# Patient Record
Sex: Female | Born: 1985 | Race: White | Hispanic: No | Marital: Married | Smoking: Never smoker
Health system: Southern US, Community
[De-identification: ages and names within clinical notes are randomized; demographics above are authoritative.]

## PROBLEM LIST (undated history)

## (undated) DIAGNOSIS — Z789 Other specified health status: Secondary | ICD-10-CM

## (undated) DIAGNOSIS — D649 Anemia, unspecified: Secondary | ICD-10-CM

## (undated) HISTORY — PX: NO PAST SURGERIES: SHX2092

---

## 2015-11-15 DIAGNOSIS — N912 Amenorrhea, unspecified: Secondary | ICD-10-CM | POA: Insufficient documentation

## 2016-06-04 DIAGNOSIS — F411 Generalized anxiety disorder: Secondary | ICD-10-CM | POA: Insufficient documentation

## 2016-06-04 DIAGNOSIS — F321 Major depressive disorder, single episode, moderate: Secondary | ICD-10-CM | POA: Insufficient documentation

## 2016-06-04 DIAGNOSIS — G44229 Chronic tension-type headache, not intractable: Secondary | ICD-10-CM | POA: Insufficient documentation

## 2016-11-05 DIAGNOSIS — N926 Irregular menstruation, unspecified: Secondary | ICD-10-CM | POA: Insufficient documentation

## 2017-01-01 ENCOUNTER — Encounter: Payer: Self-pay | Admitting: *Deleted

## 2017-01-18 ENCOUNTER — Encounter: Payer: Self-pay | Admitting: Obstetrics and Gynecology

## 2017-01-29 NOTE — L&D Delivery Note (Signed)
Delivery Note Pt progressed quickly to complete at 0139, and at 1:42 AM a viable female was delivered via Vaginal, Spontaneous (Presentation: ROA/compound hand ).  APGAR: 9,9 ; weight: pending.  Infant lifted to pt's abd and dried; cord clamped and cut by FOB after >731min had passed. Hospital cord blood sample collected. Placenta status: spont ,intact .  Cord: 3 vessel  Anesthesia:  None Episiotomy: None Lacerations: None Est. Blood Loss (mL): 300  Mom to postpartum.  Baby to Couplet care / Skin to Skin.  Cam HaiSHAW, Leelyn Jasinski CNM 07/18/2017, 1:58 AM  Please schedule this patient for Postpartum visit in: 4 weeks with the following provider: Any provider For C/S patients schedule nurse incision check in weeks 2 weeks: no Low risk pregnancy complicated by: Rh neg, IVF preg Delivery mode:  SVD Anticipated Birth Control:  Other/unsure; NFP PP Procedures needed: none  Schedule Integrated BH visit: no

## 2017-02-14 ENCOUNTER — Encounter: Payer: Self-pay | Admitting: Student

## 2017-02-14 ENCOUNTER — Ambulatory Visit (INDEPENDENT_AMBULATORY_CARE_PROVIDER_SITE_OTHER): Payer: Managed Care, Other (non HMO) | Admitting: Student

## 2017-02-14 VITALS — BP 113/62 | HR 93 | Ht 64.0 in | Wt 127.3 lb

## 2017-02-14 DIAGNOSIS — Z3482 Encounter for supervision of other normal pregnancy, second trimester: Secondary | ICD-10-CM

## 2017-02-14 DIAGNOSIS — Z23 Encounter for immunization: Secondary | ICD-10-CM | POA: Diagnosis not present

## 2017-02-14 DIAGNOSIS — E282 Polycystic ovarian syndrome: Secondary | ICD-10-CM

## 2017-02-14 DIAGNOSIS — Z3480 Encounter for supervision of other normal pregnancy, unspecified trimester: Secondary | ICD-10-CM

## 2017-02-14 DIAGNOSIS — N970 Female infertility associated with anovulation: Secondary | ICD-10-CM

## 2017-02-14 DIAGNOSIS — Z113 Encounter for screening for infections with a predominantly sexual mode of transmission: Secondary | ICD-10-CM | POA: Diagnosis not present

## 2017-02-14 NOTE — Patient Instructions (Signed)
Considering Waterbirth? Guide for patients at [facility] [employer] [facility]  Why consider waterbirth?  . Gentle birth for babies . Less pain medicine used in labor . May allow for passive descent/less pushing . May reduce perineal tears  . More mobility and instinctive maternal position changes . Increased maternal relaxation . Reduced blood pressure in labor  Is waterbirth safe? What are the risks of infection, drowning or other complications?  . Infection: o Very low risk (3.7 % for tub vs 4.8% for bed) o 7 in 8000 waterbirths with documented infection o Poorly cleaned equipment most common cause o Slightly lower group B strep transmission rate  . Drowning o Maternal:  - Very low risk   - Related to seizures or fainting o Newborn:  - Very low risk. No evidence of increased risk of respiratory problems in multiple large studies - Physiological protection from breathing under water - Avoid underwater birth if there are any fetal complications - Once baby's head is out of the water, keep it out.  . Birth complication o Some reports of cord trauma, but risk decreased by bringing baby to surface gradually o No evidence of increased risk of shoulder dystocia. Mothers can usually change positions faster in water than in a bed, possibly aiding the maneuvers to free the shoulder.   You must attend a Doren Custard class at [facility]  3rd Wednesday of every month from 7-9pm  [employer] by calling [phone] or online at [email or web address]  Bring [place] the certificate from the class to your prenatal appointment  Meet with a midwife at 36 weeks to see if you can still plan a waterbirth and to sign the consent.   Purchase or rent the following supplies:   Water Birth Pool (Birth Pool in a Box or [place] for instance)  (Tubs start ~$125)  Single-use disposable tub liner designed for your brand of tub  New garden hose labeled "lead-free", "suitable for drinking  water",  Electric drain pump to remove water (We recommend 792 gallon per hour or greater pump.)   Separate garden hose to remove the dirty water  Fish net  Bathing suit top (optional)  Long-handled mirror (optional)  Places to purchase or rent supplies  GotWeb[employer].is for tub purchases and supplies  Waterbirthsolutions.com for tub purchases and supplies  [employer] Ladies (www.thelaborladies.com) $275 for tub rental/set-up & take down/kit   [employer] (http://www.fleming.com/.htm) Information regarding doulas (labor support) who provide pool rentals  Our practice has a Birth Pool in a Box tub at the hospital that you may borrow on a first-come-first-served basis. It is your responsibility to to set up, clean and break down the tub. We cannot guarantee the availability of this tub in advance. You are responsible for bringing all accessories listed above. If you do not have all necessary supplies you cannot have a waterbirth.    Things that would prevent you from having a waterbirth:  Premature, <37wks  Previous cesarean birth  Presence of thick meconium-stained fluid  Multiple gestation (Twins, triplets, etc.)  Uncontrolled diabetes or gestational diabetes requiring medication  Hypertension requiring medication or diagnosis of pre-eclampsia  Heavy vaginal bleeding  Non-reassuring fetal heart rate  Active infection (MRSA, etc.). Group B Strep is NOT a contraindication for  waterbirth.  If your labor has to be induced and induction method requires continuous  monitoring of the baby's heart rate  Other risks/issues identified by your obstetrical provider  Please remember that birth is unpredictable. Under certain unforeseeable  circumstances your provider may advise against giving birth in the tub. These decisions will be made on a case-by-case basis and with the safety of you and your baby as our highest priority.

## 2017-02-14 NOTE — Progress Notes (Signed)
  Subjective:    Amber BridgeChristy Garrison is being seen today for her first obstetrical visit.  This is a planned pregnancy. She is at 1066w3d gestation. Her obstetrical history is significant for IVF pregnancy. . Relationship with FOB: spouse, living together. Patient does intend to breast feed. Pregnancy history fully reviewed.  Patient reports no complaints; occasional vomiting (every other day). Feels good overall.   Review of Systems:   Review of Systems  Constitutional: Negative.   HENT: Negative.   Respiratory: Negative.   Cardiovascular: Negative.   Gastrointestinal: Negative.   Genitourinary: Negative.   Neurological: Negative.   Hematological: Negative.     Objective:     BP 113/62   Pulse 93   Ht 5\' 4"  (1.626 m)   Wt 127 lb 4.8 oz (57.7 kg)   LMP 10/15/2016 (Exact Date)   BMI 21.85 kg/m  Physical Exam  Constitutional: She is oriented to person, place, and time. She appears well-developed.  HENT:  Head: Normocephalic.  Eyes: Pupils are equal, round, and reactive to light.  Neck: Normal range of motion.  Cardiovascular: Normal rate.  Respiratory: Effort normal.  GI: Soft.  Musculoskeletal: Normal range of motion.  Neurological: She is alert and oriented to person, place, and time.  Skin: Skin is warm and dry.    Exam    Assessment:    Pregnancy: [id number] Patient Active Problem List   Diagnosis Date Noted  . Supervision of other normal pregnancy, antepartum 02/14/2017  . Infertility associated with anovulation 02/14/2017       Plan:     Initial labs drawn. Prenatal vitamins. Problem list reviewed and updated. AFP3 discussed: declined. Role of ultrasound in pregnancy discussed; fetal survey: ordered. Amniocentesis discussed: not indicated. Follow up in 4 weeks. 50% of 30  min visit spent on counseling and coordination of care.  -[facility] and explained role of residents, etc.  -Information given on waterbirth and on Vitamin b6 and  unisom.   Amber GaribaldiKathryn Lorraine [facility] 02/14/2017

## 2017-02-15 LAB — OBSTETRIC PANEL, INCLUDING HIV
Antibody Screen: NEGATIVE
BASOS ABS: 0 10*3/uL (ref 0.0–0.2)
Basos: 0 %
EOS (ABSOLUTE): 0.1 10*3/uL (ref 0.0–0.4)
Eos: 1 %
HIV SCREEN 4TH GENERATION: NONREACTIVE
Hematocrit: 39.1 % (ref 34.0–46.6)
Hemoglobin: 13.2 g/dL (ref 11.1–15.9)
Hepatitis B Surface Ag: NEGATIVE
IMMATURE GRANULOCYTES: 0 %
Immature Grans (Abs): 0 10*3/uL (ref 0.0–0.1)
LYMPHS ABS: 1.1 10*3/uL (ref 0.7–3.1)
Lymphs: 14 %
MCH: 30.9 pg (ref 26.6–33.0)
MCHC: 33.8 g/dL (ref 31.5–35.7)
MCV: 92 fL (ref 79–97)
MONOCYTES: 6 %
Monocytes Absolute: 0.4 10*3/uL (ref 0.1–0.9)
NEUTROS ABS: 5.7 10*3/uL (ref 1.4–7.0)
NEUTROS PCT: 79 %
PLATELETS: 182 10*3/uL (ref 150–379)
RBC: 4.27 x10E6/uL (ref 3.77–5.28)
RDW: 14.2 % (ref 12.3–15.4)
RPR Ser Ql: NONREACTIVE
Rh Factor: NEGATIVE
Rubella Antibodies, IGG: 3.1 index (ref >0.99)
WBC: 7.3 10*3/uL (ref 3.4–10.8)

## 2017-02-15 LAB — GC/CHLAMYDIA PROBE AMP (~~LOC~~) NOT AT ARMC
CHLAMYDIA, DNA PROBE: NEGATIVE
NEISSERIA GONORRHEA: NEGATIVE

## 2017-02-16 ENCOUNTER — Encounter: Payer: Self-pay | Admitting: Student

## 2017-02-16 DIAGNOSIS — O26899 Other specified pregnancy related conditions, unspecified trimester: Secondary | ICD-10-CM

## 2017-02-16 DIAGNOSIS — Z6791 Unspecified blood type, Rh negative: Secondary | ICD-10-CM | POA: Insufficient documentation

## 2017-02-16 LAB — CULTURE, OB URINE

## 2017-02-16 LAB — URINE CULTURE, OB REFLEX: Organism ID, Bacteria: NO GROWTH

## 2017-02-21 ENCOUNTER — Encounter (HOSPITAL_COMMUNITY): Payer: Self-pay | Admitting: Student

## 2017-02-21 LAB — SMN1 COPY NUMBER ANALYSIS (SMA CARRIER SCREENING)

## 2017-02-28 ENCOUNTER — Ambulatory Visit (HOSPITAL_COMMUNITY)
Admission: RE | Admit: 2017-02-28 | Discharge: 2017-02-28 | Disposition: A | Discharge status: Home / Self Care | Payer: Managed Care, Other (non HMO) | Source: Ambulatory Visit | Attending: Student | Admitting: Student

## 2017-02-28 ENCOUNTER — Encounter: Payer: Self-pay | Admitting: Student

## 2017-02-28 ENCOUNTER — Other Ambulatory Visit: Payer: Self-pay | Admitting: Student

## 2017-02-28 ENCOUNTER — Other Ambulatory Visit (HOSPITAL_COMMUNITY): Payer: Self-pay | Admitting: *Deleted

## 2017-02-28 ENCOUNTER — Encounter (HOSPITAL_COMMUNITY): Payer: Self-pay

## 2017-02-28 DIAGNOSIS — N970 Female infertility associated with anovulation: Secondary | ICD-10-CM

## 2017-02-28 DIAGNOSIS — Z3A19 19 weeks gestation of pregnancy: Secondary | ICD-10-CM | POA: Diagnosis not present

## 2017-02-28 DIAGNOSIS — Z363 Encounter for antenatal screening for malformations: Secondary | ICD-10-CM | POA: Insufficient documentation

## 2017-02-28 DIAGNOSIS — O4440 Low lying placenta NOS or without hemorrhage, unspecified trimester: Secondary | ICD-10-CM

## 2017-02-28 HISTORY — DX: Other specified health status: Z78.9

## 2017-03-15 ENCOUNTER — Encounter: Payer: Managed Care, Other (non HMO) | Admitting: Student

## 2017-03-26 ENCOUNTER — Ambulatory Visit (INDEPENDENT_AMBULATORY_CARE_PROVIDER_SITE_OTHER): Payer: Managed Care, Other (non HMO) | Admitting: Advanced Practice Midwife

## 2017-03-26 ENCOUNTER — Encounter: Payer: Self-pay | Admitting: Advanced Practice Midwife

## 2017-03-26 VITALS — BP 103/58 | HR 79 | Wt 135.0 lb

## 2017-03-26 DIAGNOSIS — Z3480 Encounter for supervision of other normal pregnancy, unspecified trimester: Secondary | ICD-10-CM

## 2017-03-26 DIAGNOSIS — O09899 Supervision of other high risk pregnancies, unspecified trimester: Secondary | ICD-10-CM

## 2017-03-26 DIAGNOSIS — O26899 Other specified pregnancy related conditions, unspecified trimester: Principal | ICD-10-CM

## 2017-03-26 DIAGNOSIS — Z6791 Unspecified blood type, Rh negative: Secondary | ICD-10-CM

## 2017-03-26 NOTE — Patient Instructions (Signed)
Second Trimester of Pregnancy The second trimester is from week 13 through week 28, month 4 through 6. This is often the time in pregnancy that you feel your best. Often times, morning sickness has lessened or quit. You may have more energy, and you may get hungry more often. Your unborn baby (fetus) is growing rapidly. At the end of the sixth month, he or she is about 9 inches long and weighs about 1 pounds. You will likely feel the baby move (quickening) between 18 and 20 weeks of pregnancy. Follow these instructions at home:  Avoid all smoking, herbs, and alcohol. Avoid drugs not approved by your doctor.  Do not use any tobacco products, including cigarettes, chewing tobacco, and electronic cigarettes. If you need help quitting, ask your doctor. You may get counseling or other support to help you quit.  Only take medicine as told by your doctor. Some medicines are safe and some are not during pregnancy.  Exercise only as told by your doctor. Stop exercising if you start having cramps.  Eat regular, healthy meals.  Wear a good support bra if your breasts are tender.  Do not use hot tubs, steam rooms, or saunas.  Wear your seat belt when driving.  Avoid raw meat, uncooked cheese, and liter boxes and soil used by cats.  Take your prenatal vitamins.  Take 1500-2000 milligrams of calcium daily starting at the 20th week of pregnancy until you deliver your baby.  Try taking medicine that helps you poop (stool softener) as needed, and if your doctor approves. Eat more fiber by eating fresh fruit, vegetables, and whole grains. Drink enough fluids to keep your pee (urine) clear or pale yellow.  Take warm water baths (sitz baths) to soothe pain or discomfort caused by hemorrhoids. Use hemorrhoid cream if your doctor approves.  If you have puffy, bulging veins (varicose veins), wear support hose. Raise (elevate) your feet for 15 minutes, 3-4 times a day. Limit salt in your diet.  Avoid heavy  lifting, wear low heals, and sit up straight.  Rest with your legs raised if you have leg cramps or low back pain.  Visit your dentist if you have not gone during your pregnancy. Use a soft toothbrush to brush your teeth. Be gentle when you floss.  You can have sex (intercourse) unless your doctor tells you not to.  Go to your doctor visits. Get help if:  You feel dizzy.  You have mild cramps or pressure in your lower belly (abdomen).  You have a nagging pain in your belly area.  You continue to feel sick to your stomach (nauseous), throw up (vomit), or have watery poop (diarrhea).  You have bad smelling fluid coming from your vagina.  You have pain with peeing (urination). Get help right away if:  You have a fever.  You are leaking fluid from your vagina.  You have spotting or bleeding from your vagina.  You have severe belly cramping or pain.  You lose or gain weight rapidly.  You have trouble catching your breath and have chest pain.  You notice sudden or extreme puffiness (swelling) of your face, hands, ankles, feet, or legs.  You have not felt the baby move in over an hour.  You have severe headaches that do not go away with medicine.  You have vision changes. This information is not intended to replace advice given to you by your health care provider. Make sure you discuss any questions you have with your health care   provider. Document Released: 04/11/2009 Document Revised: 06/23/2015 Document Reviewed: 03/18/2012 Elsevier Interactive Patient Education  2017 Elsevier Inc.  

## 2017-03-26 NOTE — Progress Notes (Signed)
   PRENATAL VISIT NOTE  Subjective:  Amber Garrison is a 32 y.o. G3P2002 at 6230w1d being seen today for ongoing prenatal care.  She is currently monitored for the following issues for this low-risk pregnancy and has Supervision of other normal pregnancy, antepartum; Infertility associated with anovulation; PCOS (polycystic ovarian syndrome); Rh negative state in antepartum period; Low-lying placenta; Amenorrhea; Chronic tension-type headache, not intractable; Current moderate episode of major depressive disorder without prior episode (HCC); GAD (generalized anxiety disorder); and Irregular periods/menstrual cycles on their problem list.  Patient reports intermittent groin pain when sitting.  Contractions: Not present. Vag. Bleeding: None.  Movement: Present. Denies leaking of fluid.   The following portions of the patient's history were reviewed and updated as appropriate: allergies, current medications, past family history, past medical history, past social history, past surgical history and problem list. Problem list updated.  Objective:   Vitals:   03/26/17 0838  BP: (!) 103/58  Pulse: 79  Weight: 135 lb (61.2 kg)    Fetal Status: Fetal Heart Rate (bpm): 155   Movement: Present     General:  Alert, oriented and cooperative. Patient is in no acute distress.  Skin: Skin is warm and dry. No rash noted.   Cardiovascular: Normal heart rate noted  Respiratory: Normal respiratory effort, no problems with respiration noted  Abdomen: Soft, gravid, appropriate for gestational age.  Pain/Pressure: Present     Pelvic: Cervical exam deferred        Extremities: Normal range of motion.  Edema: None  Mental Status:  Normal mood and affect. Normal behavior. Normal judgment and thought content.   Assessment and Plan:  Pregnancy: G3P2002 at 2930w1d  1. Supervision of other normal pregnancy, antepartum     Discussed groin pain is likely due to ligament softening.  Pillows, position changes discussed   Discussed waterbirth in detail.  Packet given.  May want to use our tub, reviewed responsibilities. Last labor was fast, discussed time management if doing waterbirth. Wants photography, discussed none of birth but before and after are OK.       Reviewed hospital staffing with fellows and midwives.  Reviewed MAU, how and when to use  2.    Rh Neg, Rhig next visit  Preterm labor symptoms and general obstetric precautions including but not limited to vaginal bleeding, contractions, leaking of fluid and fetal movement were reviewed in detail with the patient. Please refer to After Visit Summary for other counseling recommendations.   RTO 4 wks for glucola and Rhophylac   Wynelle BourgeoisMarie Jil Penland, CNM

## 2017-04-11 ENCOUNTER — Encounter (HOSPITAL_COMMUNITY): Payer: Self-pay

## 2017-04-11 ENCOUNTER — Ambulatory Visit (HOSPITAL_COMMUNITY)
Admission: RE | Admit: 2017-04-11 | Discharge: 2017-04-11 | Disposition: A | Discharge status: Home / Self Care | Payer: Managed Care, Other (non HMO) | Source: Ambulatory Visit | Attending: Student | Admitting: Student

## 2017-04-11 ENCOUNTER — Encounter: Payer: Managed Care, Other (non HMO) | Admitting: Student

## 2017-04-11 ENCOUNTER — Other Ambulatory Visit (HOSPITAL_COMMUNITY): Payer: Self-pay | Admitting: *Deleted

## 2017-04-11 ENCOUNTER — Other Ambulatory Visit (HOSPITAL_COMMUNITY): Payer: Self-pay | Admitting: Maternal and Fetal Medicine

## 2017-04-11 DIAGNOSIS — Z362 Encounter for other antenatal screening follow-up: Secondary | ICD-10-CM

## 2017-04-11 DIAGNOSIS — O09812 Supervision of pregnancy resulting from assisted reproductive technology, second trimester: Secondary | ICD-10-CM

## 2017-04-11 DIAGNOSIS — Z3A25 25 weeks gestation of pregnancy: Secondary | ICD-10-CM | POA: Diagnosis not present

## 2017-04-11 DIAGNOSIS — O4440 Low lying placenta NOS or without hemorrhage, unspecified trimester: Secondary | ICD-10-CM

## 2017-04-23 ENCOUNTER — Ambulatory Visit (INDEPENDENT_AMBULATORY_CARE_PROVIDER_SITE_OTHER): Payer: Managed Care, Other (non HMO) | Admitting: Advanced Practice Midwife

## 2017-04-23 ENCOUNTER — Encounter: Payer: Self-pay | Admitting: Advanced Practice Midwife

## 2017-04-23 VITALS — BP 104/61 | HR 79 | Wt 136.0 lb

## 2017-04-23 DIAGNOSIS — Z23 Encounter for immunization: Secondary | ICD-10-CM | POA: Diagnosis not present

## 2017-04-23 DIAGNOSIS — Z3480 Encounter for supervision of other normal pregnancy, unspecified trimester: Secondary | ICD-10-CM

## 2017-04-23 DIAGNOSIS — Z3482 Encounter for supervision of other normal pregnancy, second trimester: Secondary | ICD-10-CM | POA: Diagnosis not present

## 2017-04-23 DIAGNOSIS — O360920 Maternal care for other rhesus isoimmunization, second trimester, not applicable or unspecified: Secondary | ICD-10-CM

## 2017-04-23 DIAGNOSIS — Z3490 Encounter for supervision of normal pregnancy, unspecified, unspecified trimester: Secondary | ICD-10-CM

## 2017-04-23 DIAGNOSIS — K219 Gastro-esophageal reflux disease without esophagitis: Secondary | ICD-10-CM

## 2017-04-23 MED ORDER — RHO D IMMUNE GLOBULIN 1500 UNIT/2ML IJ SOSY
300.0000 ug | PREFILLED_SYRINGE | Freq: Once | INTRAMUSCULAR | Status: AC
  Administered 2017-04-23: 300 ug via INTRAMUSCULAR

## 2017-04-23 NOTE — Progress Notes (Signed)
   PRENATAL VISIT NOTE  Subjective:  Amber Garrison is a 32 y.o. G3P2002 at 2221w1d being seen today for ongoing prenatal care.  She is currently monitored for the following issues for this high-risk pregnancy and has Supervision of other normal pregnancy, antepartum; Infertility associated with anovulation; PCOS (polycystic ovarian syndrome); Rh negative state in antepartum period; Low-lying placenta; Amenorrhea; Chronic tension-type headache, not intractable; Current moderate episode of major depressive disorder without prior episode (HCC); GAD (generalized anxiety disorder); and Irregular periods/menstrual cycles on their problem list.  Patient reports no complaints.  Contractions: Not present. Vag. Bleeding: None.  Movement: Present. Denies leaking of fluid.   The following portions of the patient's history were reviewed and updated as appropriate: allergies, current medications, past family history, past medical history, past social history, past surgical history and problem list. Problem list updated.  Objective:   Vitals:   04/23/17 0815  BP: 104/61  Pulse: 79  Weight: 136 lb (61.7 kg)    Fetal Status:   Fundal Height: 28 cm Movement: Present     General:  Alert, oriented and cooperative. Patient is in no acute distress.  Skin: Skin is warm and dry. No rash noted.   Cardiovascular: Normal heart rate noted  Respiratory: Normal respiratory effort, no problems with respiration noted  Abdomen: Soft, gravid, appropriate for gestational age.  Pain/Pressure: Present     Pelvic: Cervical exam deferred        Extremities: Normal range of motion.  Edema: None  Mental Status:  Normal mood and affect. Normal behavior. Normal judgment and thought content.  US findings: SIUP at 25+3 weeks  Normal interval anatomy; anatomic survey complete  Normal amniotic fluid volume  Appropriate interval growth with EFW at the 57th %tile  EV views of cervix and placenta: normal length without  funneling;  inferior edge of posterior placenta ends 2.1 cms  from internal os  Posterior placenta; no previa ---------------------------------------------------------------------- Recommendations  Follow-up ultrasound in 6 weeks to reassess growth and  placenta location  Assessment and Plan:  Pregnancy: G3P2002 at 4121w1d  1. Prenatal care, antepartum      Has class for waterbirth coming up       Is scheduled for followup US for growth and placental location (now away from os)  - Glucose Tolerance, 2 Hours w/1 Hour - RPR - HIV antibody (with reflex) - CBC - Rh Type  2.    Acid reflux      Reviewed options of conservative management (elevated HOB, water after dinner), acid reducers and Reglan to help with gastric emptying.  Prefers not to use medication.  3.    RH Negative      Rhophylac today  Preterm labor symptoms and general obstetric precautions including but not limited to vaginal bleeding, contractions, leaking of fluid and fetal movement were reviewed in detail with the patient. Please refer to After Visit Summary for other counseling recommendations.   RTO 2 weeks   Wynelle BourgeoisMarie Gibril Mastro, CNM

## 2017-04-23 NOTE — Patient Instructions (Signed)

## 2017-04-23 NOTE — Progress Notes (Incomplete)
   PRENATAL VISIT NOTE  Subjective:  Amber Garrison is a 32 y.o. G3P2002 at [redacted]w[redacted]d being seen today for ongoing prenatal care.  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  Patient reports {sx:14538}.  Contractions: Not present. Vag. Bleeding: None.  Movement: Present. Denies leaking of fluid.   The following portions of the patient's history were reviewed and updated as appropriate: allergies, current medications, past family history, past medical history, past social history, past surgical history and problem list. Problem list updated.  Objective:   Vitals:   04/23/17 0815  BP: 104/61  Pulse: 79  Weight: 136 lb (61.7 kg)    Fetal Status:   Fundal Height: 28 cm Movement: Present     General:  Alert, oriented and cooperative. Patient is in no acute distress.  Skin: Skin is warm and dry. No rash noted.   Cardiovascular: Normal heart rate noted  Respiratory: Normal respiratory effort, no problems with respiration noted  Abdomen: Soft, gravid, appropriate for gestational age.  Pain/Pressure: Present     Pelvic: {Blank single:19197::"Cervical exam performed","Cervical exam deferred"}        Extremities: Normal range of motion.  Edema: None  Mental Status:  Normal mood and affect. Normal behavior. Normal judgment and thought content.   Assessment and Plan:  Pregnancy: G3P2002 at [redacted]w[redacted]d  1. Prenatal care, antepartum *** - Glucose Tolerance, 2 Hours w/1 Hour - RPR - HIV antibody (with  reflex) - CBC - Rh Type  {Blank single:19197::"Term","Preterm"} labor symptoms and general obstetric precautions including but not limited to vaginal bleeding, contractions, leaking of fluid and fetal movement were reviewed in detail with the patient. Please refer to After Visit Summary for other counseling recommendations.  No follow-ups on file.   Nasir Bright, CNM 

## 2017-04-23 NOTE — Progress Notes (Deleted)
   PRENATAL VISIT NOTE  Subjective:  Amber Garrison is a 31 y.o. G3P2002 at [redacted]w[redacted]d being seen today for ongoing prenatal care.  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  Patient reports {sx:14538}.  Contractions: Not present. Vag. Bleeding: None.  Movement: Present. Denies leaking of fluid.   The following portions of the patient's history were reviewed and updated as appropriate: allergies, current medications, past family history, past medical history, past social history, past surgical history and problem list. Problem list updated.  Objective:   Vitals:   04/23/17 0815  BP: 104/61  Pulse: 79  Weight: 136 lb (61.7 kg)    Fetal Status:   Fundal Height: 28 cm Movement: Present     General:  Alert, oriented and cooperative. Patient is in no acute distress.  Skin: Skin is warm and dry. No rash noted.   Cardiovascular: Normal heart rate noted  Respiratory: Normal respiratory effort, no problems with respiration noted  Abdomen: Soft, gravid, appropriate for gestational age.  Pain/Pressure: Present     Pelvic: {Blank single:19197::"Cervical exam performed","Cervical exam deferred"}        Extremities: Normal range of motion.  Edema: None  Mental Status:  Normal mood and affect. Normal behavior. Normal judgment and thought content.   Assessment and Plan:  Pregnancy: G3P2002 at [redacted]w[redacted]d  1. Prenatal care, antepartum *** - Glucose Tolerance, 2 Hours w/1 Hour - RPR - HIV antibody (with  reflex) - CBC - Rh Type  {Blank single:19197::"Term","Preterm"} labor symptoms and general obstetric precautions including but not limited to vaginal bleeding, contractions, leaking of fluid and fetal movement were reviewed in detail with the patient. Please refer to After Visit Summary for other counseling recommendations.  No follow-ups on file.   Ayaana Biondo, CNM 

## 2017-04-23 NOTE — Progress Notes (Incomplete)
   PRENATAL VISIT NOTE  Subjective:  Amber Garrison is a 32 y.o. G3P2002 at 7052w1d being seen today for ongoing prenatal care.  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  Patient reports {sx:14538}.  Contractions: Not present. Vag. Bleeding: None.  Movement: Present. Denies leaking of fluid.   The following portions of the patient's history were reviewed and updated as appropriate: allergies, current medications, past family history, past medical history, past social history, past surgical history and problem list. Problem list updated.  Objective:   Vitals:   04/23/17 0815  BP: 104/61  Pulse: 79  Weight: 136 lb (61.7 kg)    Fetal Status:   Fundal Height: 28 cm Movement: Present     General:  Alert, oriented and cooperative. Patient is in no acute distress.  Skin: Skin is warm and dry. No rash noted.   Cardiovascular: Normal heart rate noted  Respiratory: Normal respiratory effort, no problems with respiration noted  Abdomen: Soft, gravid, appropriate for gestational age.  Pain/Pressure: Present     Pelvic: {Blank single:19197::"Cervical exam performed","Cervical exam deferred"}        Extremities: Normal range of motion.  Edema: None  Mental Status:  Normal mood and affect. Normal behavior. Normal judgment and thought content.   Assessment and Plan:  Pregnancy: G3P2002 at 6652w1d  1. Prenatal care, antepartum *** - Glucose Tolerance, 2 Hours w/1 Hour - RPR - HIV antibody (with  reflex) - CBC - Rh Type  {Blank single:19197::"Term","Preterm"} labor symptoms and general obstetric precautions including but not limited to vaginal bleeding, contractions, leaking of fluid and fetal movement were reviewed in detail with the patient. Please refer to After Visit Summary for other counseling recommendations.  No follow-ups on file.   Wynelle BourgeoisMarie Donterius Filley, CNM

## 2017-04-24 LAB — RPR: RPR: NONREACTIVE

## 2017-04-24 LAB — CBC
Hematocrit: 33.7 % — ABNORMAL LOW (ref 34.0–46.6)
Hemoglobin: 11.2 g/dL (ref 11.1–15.9)
MCH: 29.6 pg (ref 26.6–33.0)
MCHC: 33.2 g/dL (ref 31.5–35.7)
MCV: 89 fL (ref 79–97)
Platelets: 169 10*3/uL (ref 150–379)
RBC: 3.79 x10E6/uL (ref 3.77–5.28)
RDW: 14.1 % (ref 12.3–15.4)
WBC: 8.6 10*3/uL (ref 3.4–10.8)

## 2017-04-24 LAB — GLUCOSE TOLERANCE, 2 HOURS W/ 1HR
GLUCOSE, 1 HOUR: 119 mg/dL (ref 65–179)
GLUCOSE, 2 HOUR: 116 mg/dL (ref 65–152)
GLUCOSE, FASTING: 76 mg/dL (ref 65–91)

## 2017-04-24 LAB — HIV ANTIBODY (ROUTINE TESTING W REFLEX): HIV SCREEN 4TH GENERATION: NONREACTIVE

## 2017-04-24 LAB — RH TYPE: RH TYPE: NEGATIVE

## 2017-05-14 ENCOUNTER — Ambulatory Visit (INDEPENDENT_AMBULATORY_CARE_PROVIDER_SITE_OTHER): Payer: Managed Care, Other (non HMO) | Admitting: Nurse Practitioner

## 2017-05-14 VITALS — BP 107/67 | HR 89 | Wt 136.0 lb

## 2017-05-14 DIAGNOSIS — Z3483 Encounter for supervision of other normal pregnancy, third trimester: Secondary | ICD-10-CM

## 2017-05-14 DIAGNOSIS — Z3480 Encounter for supervision of other normal pregnancy, unspecified trimester: Secondary | ICD-10-CM

## 2017-05-14 NOTE — Progress Notes (Signed)
Pt c/o feeling dizzy and out of breath.

## 2017-05-14 NOTE — Patient Instructions (Signed)
Eat protein with every meal.

## 2017-05-14 NOTE — Progress Notes (Signed)
    Subjective:  Amber BridgeChristy Garrison is a 32 y.o. G3P2002 at 2742w1d being seen today for ongoing prenatal care.  She is currently monitored for the following issues for this low-risk pregnancy and has Supervision of other normal pregnancy, antepartum; Infertility associated with anovulation; PCOS (polycystic ovarian syndrome); Rh negative state in antepartum period; Low-lying placenta; Amenorrhea; Chronic tension-type headache, not intractable; Current moderate episode of major depressive disorder without prior episode (HCC); GAD (generalized anxiety disorder); Irregular periods/menstrual cycles; and Gastroesophageal reflux on their problem list.  Patient reports having dizziness and feeling out of breath different from her other pregnancies but she is working now and was not working in her other pregnancies..  Contractions: Not present. Vag. Bleeding: None.  Movement: Present. Denies leaking of fluid.   The following portions of the patient's history were reviewed and updated as appropriate: allergies, current medications, past family history, past medical history, past social history, past surgical history and problem list. Problem list updated.  Objective:   Vitals:   05/14/17 0832  BP: 107/67  Pulse: 89  Weight: 136 lb (61.7 kg)    Fetal Status: Fetal Heart Rate (bpm): 141 Fundal Height: 30 cm Movement: Present     General:  Alert, oriented and cooperative. Patient is in no acute distress.  Skin: Skin is warm and dry. No rash noted.   Cardiovascular: Normal heart rate noted  Respiratory: Normal respiratory effort, no problems with respiration noted  Abdomen: Soft, gravid, appropriate for gestational age. Pain/Pressure: Absent     Pelvic:  Cervical exam deferred        Extremities: Normal range of motion.  Edema: None  Mental Status: Normal mood and affect. Normal behavior. Normal judgment and thought content.   Urinalysis:      Assessment and Plan:  Pregnancy: G3P2002 at 3442w1d  1.  Supervision of other normal pregnancy, antepartum Discussed dizziness and eating protein at every meal - today had oatmeal with no protein.  Advised that protein at every meal will stabilize blood sugar and may decrease episodes of dizziness.  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  Drink at least 8 8-oz glasses of water every day. Does not want any medication for heartburn - taking tums periodically Has US on 06-06-17 to evaluate low lying placenta again - currently no bleeding.  Preterm labor symptoms and general obstetric precautions including but not limited to vaginal bleeding, contractions, leaking of fluid and fetal movement were reviewed in detail with the patient. Please refer to After Visit Summary for other counseling recommendations.  Return in about 1 week (around 05/21/2017).  Nolene BernheimERRI Sewell Pitner, RN, MSN, NP-BC Nurse Practitioner, [facility], [facility] 05/14/2017 9:14 AM

## 2017-05-28 ENCOUNTER — Encounter: Payer: Self-pay | Admitting: Advanced Practice Midwife

## 2017-05-28 ENCOUNTER — Ambulatory Visit (INDEPENDENT_AMBULATORY_CARE_PROVIDER_SITE_OTHER): Payer: Managed Care, Other (non HMO) | Admitting: Advanced Practice Midwife

## 2017-05-28 VITALS — BP 107/60 | HR 96 | Wt 138.0 lb

## 2017-05-28 DIAGNOSIS — Z3480 Encounter for supervision of other normal pregnancy, unspecified trimester: Secondary | ICD-10-CM

## 2017-05-28 DIAGNOSIS — O4440 Low lying placenta NOS or without hemorrhage, unspecified trimester: Secondary | ICD-10-CM

## 2017-05-28 NOTE — Progress Notes (Incomplete)
   PRENATAL VISIT NOTE  Subjective:  Amber Garrison is a 31 y.o. G3P2002 at [redacted]w[redacted]d being seen today for ongoing prenatal care.  She is currently monitored for the following issues for this {Blank single:19197::"high-risk","low-risk"} pregnancy and has Supervision of other normal pregnancy, antepartum; Infertility associated with anovulation; PCOS (polycystic ovarian syndrome); Rh negative state in antepartum period; Low-lying placenta; Amenorrhea; Chronic tension-type headache, not intractable; Current moderate episode of major depressive disorder without prior episode (HCC); GAD (generalized anxiety disorder); Irregular periods/menstrual cycles; and Gastroesophageal reflux on their problem list.  Patient reports {sx:14538}.  Contractions: Not present. Vag. Bleeding: None.  Movement: Present. Denies leaking of fluid.   The following portions of the patient's history were reviewed and updated as appropriate: allergies, current medications, past family history, past medical history, past social history, past surgical history and problem list. Problem list updated.  Objective:   Vitals:   05/28/17 0834  BP: 107/60  Pulse: 96  Weight: 138 lb (62.6 kg)    Fetal Status:     Movement: Present     General:  Alert, oriented and cooperative. Patient is in no acute distress.  Skin: Skin is warm and dry. No rash noted.   Cardiovascular: Normal heart rate noted  Respiratory: Normal respiratory effort, no problems with respiration noted  Abdomen: Soft, gravid, appropriate for gestational age.  Pain/Pressure: Absent     Pelvic: {Blank single:19197::"Cervical exam performed","Cervical exam deferred"}        Extremities: Normal range of motion.  Edema: None  Mental Status: Normal mood and affect. Normal behavior. Normal judgment and thought content.   Assessment and Plan:  Pregnancy: G3P2002 at [redacted]w[redacted]d  1. Low-lying placenta ***  {Blank single:19197::"Term","Preterm"} labor symptoms and general  obstetric precautions including but not limited to vaginal bleeding, contractions, leaking of fluid and fetal movement were reviewed in detail with the patient. Please refer to After Visit Summary for other counseling recommendations.  No follow-ups on file.  Future Appointments  Date Time Provider Department Center  06/06/2017  8:15 AM [facility] US 4 [facility]  06/12/2017  4:00 PM Anyanwu, Ugonna A, MD [facility] None    Kinneth Fujiwara, CNM 

## 2017-05-28 NOTE — Progress Notes (Signed)
   PRENATAL VISIT NOTE  Subjective:  Amber Garrison is a 32 y.o. G3P2002 at [redacted]w[redacted]d being seen today for ongoing prenatal care.  She is currently monitored for the following issues for this low-risk pregnancy and has Supervision of other normal pregnancy, antepartum; Infertility associated with anovulation; PCOS (polycystic ovarian syndrome); Rh negative state in antepartum period; Low-lying placenta; Amenorrhea; Chronic tension-type headache, not intractable; Current moderate episode of major depressive disorder without prior episode (HCC); GAD (generalized anxiety disorder); Irregular periods/menstrual cycles; and Gastroesophageal reflux on their problem list.  Patient reports no complaints and Planning waterbirth.  Stopping work soon.  Contractions: Not present. Vag. Bleeding: None.  Movement: Present. Denies leaking of fluid.   The following portions of the patient's history were reviewed and updated as appropriate: allergies, current medications, past family history, past medical history, past social history, past surgical history and problem list. Problem list updated.  Objective:   Vitals:   05/28/17 0834  BP: 107/60  Pulse: 96  Weight: 138 lb (62.6 kg)    Fetal Status:     Movement: Present     General:  Alert, oriented and cooperative. Patient is in no acute distress.  Skin: Skin is warm and dry. No rash noted.   Cardiovascular: Normal heart rate noted  Respiratory: Normal respiratory effort, no problems with respiration noted  Abdomen: Soft, gravid, appropriate for gestational age.  Pain/Pressure: Absent     Pelvic: Cervical exam deferred        Extremities: Normal range of motion.  Edema: None  Mental Status: Normal mood and affect. Normal behavior. Normal judgment and thought content.  FHR 150  Assessment and Plan:  Pregnancy: G3P2002 at [redacted]w[redacted]d  1. Low-lying placenta     2.1cm from os.  Dr Sherrie George recommends repeat US in May for growth and placental location  Preterm labor  symptoms and general obstetric precautions including but not limited to vaginal bleeding, contractions, leaking of fluid and fetal movement were reviewed in detail with the patient. Please refer to After Visit Summary for other counseling recommendations.  RTO 2 weeks  Future Appointments  Date Time Provider Department Center  06/06/2017  8:15 AM [facility] [place] 4 [facility]  06/12/2017  4:00 PM Anyanwu, Jethro Bastos, MD [facility] None    Wynelle Bourgeois, CNM

## 2017-05-28 NOTE — Patient Instructions (Signed)

## 2017-05-28 NOTE — Progress Notes (Deleted)
   PRENATAL VISIT NOTE  Subjective:  Amber Garrison is a 32 y.o. G3P2002 at [redacted]w[redacted]d being seen today for ongoing prenatal care.  She is currently monitored for the following issues for this {Blank single:19197::"high-risk","low-risk"} pregnancy and has Supervision of other normal pregnancy, antepartum; Infertility associated with anovulation; PCOS (polycystic ovarian syndrome); Rh negative state in antepartum period; Low-lying placenta; Amenorrhea; Chronic tension-type headache, not intractable; Current moderate episode of major depressive disorder without prior episode (HCC); GAD (generalized anxiety disorder); Irregular periods/menstrual cycles; and Gastroesophageal reflux on their problem list.  Patient reports {sx:14538}.  Contractions: Not present. Vag. Bleeding: None.  Movement: Present. Denies leaking of fluid.   The following portions of the patient's history were reviewed and updated as appropriate: allergies, current medications, past family history, past medical history, past social history, past surgical history and problem list. Problem list updated.  Objective:   Vitals:   05/28/17 0834  BP: 107/60  Pulse: 96  Weight: 138 lb (62.6 kg)    Fetal Status:     Movement: Present     General:  Alert, oriented and cooperative. Patient is in no acute distress.  Skin: Skin is warm and dry. No rash noted.   Cardiovascular: Normal heart rate noted  Respiratory: Normal respiratory effort, no problems with respiration noted  Abdomen: Soft, gravid, appropriate for gestational age.  Pain/Pressure: Absent     Pelvic: {Blank single:19197::"Cervical exam performed","Cervical exam deferred"}        Extremities: Normal range of motion.  Edema: None  Mental Status: Normal mood and affect. Normal behavior. Normal judgment and thought content.   Assessment and Plan:  Pregnancy: G3P2002 at [redacted]w[redacted]d  1. Low-lying placenta ***  {Blank single:19197::"Term","Preterm"} labor symptoms and general  obstetric precautions including but not limited to vaginal bleeding, contractions, leaking of fluid and fetal movement were reviewed in detail with the patient. Please refer to After Visit Summary for other counseling recommendations.  No follow-ups on file.  Future Appointments  Date Time Provider Department Center  06/06/2017  8:15 AM [facility] [place] 4 [facility]  06/12/2017  4:00 PM Anyanwu, Jethro Bastos, MD [facility] None    Wynelle Bourgeois, CNM

## 2017-06-06 ENCOUNTER — Ambulatory Visit (HOSPITAL_COMMUNITY)
Admission: RE | Admit: 2017-06-06 | Discharge: 2017-06-06 | Disposition: A | Discharge status: Home / Self Care | Payer: Managed Care, Other (non HMO) | Source: Ambulatory Visit | Attending: Student | Admitting: Student

## 2017-06-06 ENCOUNTER — Other Ambulatory Visit (HOSPITAL_COMMUNITY): Payer: Self-pay | Admitting: Maternal and Fetal Medicine

## 2017-06-06 ENCOUNTER — Encounter (HOSPITAL_COMMUNITY): Payer: Self-pay

## 2017-06-06 DIAGNOSIS — Z362 Encounter for other antenatal screening follow-up: Secondary | ICD-10-CM | POA: Diagnosis not present

## 2017-06-06 DIAGNOSIS — Z3A33 33 weeks gestation of pregnancy: Secondary | ICD-10-CM

## 2017-06-06 DIAGNOSIS — O09813 Supervision of pregnancy resulting from assisted reproductive technology, third trimester: Secondary | ICD-10-CM | POA: Diagnosis not present

## 2017-06-06 DIAGNOSIS — O4443 Low lying placenta NOS or without hemorrhage, third trimester: Secondary | ICD-10-CM | POA: Insufficient documentation

## 2017-06-06 DIAGNOSIS — O4440 Low lying placenta NOS or without hemorrhage, unspecified trimester: Secondary | ICD-10-CM

## 2017-06-12 ENCOUNTER — Ambulatory Visit (INDEPENDENT_AMBULATORY_CARE_PROVIDER_SITE_OTHER): Payer: Managed Care, Other (non HMO) | Admitting: Obstetrics & Gynecology

## 2017-06-12 VITALS — BP 111/68 | HR 93 | Wt 142.0 lb

## 2017-06-12 DIAGNOSIS — Z3483 Encounter for supervision of other normal pregnancy, third trimester: Secondary | ICD-10-CM

## 2017-06-12 DIAGNOSIS — Z3480 Encounter for supervision of other normal pregnancy, unspecified trimester: Secondary | ICD-10-CM

## 2017-06-12 NOTE — Progress Notes (Signed)
PRENATAL VISIT NOTE  Subjective:  Amber Garrison is a 32 y.o. G3P2002 at [redacted]w[redacted]d being seen today for ongoing prenatal care.  She is currently monitored for the following issues for this low-risk pregnancy and has Supervision of other normal pregnancy, antepartum; Infertility associated with anovulation; PCOS (polycystic ovarian syndrome); Rh negative state in antepartum period; Chronic tension-type headache, not intractable; Current moderate episode of major depressive disorder without prior episode (HCC); GAD (generalized anxiety disorder); and Gastroesophageal reflux on their problem list.  Patient reports no complaints.  Contractions: Not present. Vag. Bleeding: None.  Movement: Present. Denies leaking of fluid.   The following portions of the patient's history were reviewed and updated as appropriate: allergies, current medications, past family history, past medical history, past social history, past surgical history and problem list. Problem list updated.  Objective:   Vitals:   06/12/17 1600  BP: 111/68  Pulse: 93  Weight: 142 lb (64.4 kg)    Fetal Status: Fetal Heart Rate (bpm): 135 Fundal Height: 34 cm Movement: Present     General:  Alert, oriented and cooperative. Patient is in no acute distress.  Skin: Skin is warm and dry. No rash noted.   Cardiovascular: Normal heart rate noted  Respiratory: Normal respiratory effort, no problems with respiration noted  Abdomen: Soft, gravid, appropriate for gestational age.  Pain/Pressure: Absent     Pelvic: Cervical exam deferred        Extremities: Normal range of motion.  Edema: Trace  Mental Status: Normal mood and affect. Normal behavior. Normal judgment and thought content.   Imaging [place] Mfm Ob Transvaginal  Result Date: 06/06/2017 ----------------------------------------------------------------------  OBSTETRICS REPORT                      (Signed Final 06/06/2017 06:35 pm)  ---------------------------------------------------------------------- Patient Info  ID #:       [id number]                          D.O.B.:  1985/07/30 (31 yrs)  Name:       Amber Garrison                    Visit Date: 06/06/2017 08:35 am ---------------------------------------------------------------------- Performed By  Performed By:     Lenise Arena        Ref. Address:     [facility]                    [facility]                                                             [facility]                                                             [address]                                                             [place], [place]                                                             [address]  Attending:        Particia Nearing MD       Location:         [facility]  Referred By:      [facility] for                    [facility]                    Healthcare ---------------------------------------------------------------------- Orders   #  Description                                 Code   1  [place] MFM [facility] TRANSVAGINAL                      91478.2   2  [place] MFM OB FOLLOW UP                         95621.30  ----------------------------------------------------------------------   #  Ordered By               Order #        Accession #    Episode #   1  Particia Nearing            [id number]      [id number]     [id number]   2  MARTHA DECKER            [id number]      [id number]     [id number]  ---------------------------------------------------------------------- Indications   [redacted] weeks gestation of pregnancy                Z3A.33   Pregnancy resulting from assisted              O55.819   reproductive technology (IVF)   Encounter for other antenatal screening        Z36.2   follow-up   Low lying placenta, antepartum                 O44.40  ---------------------------------------------------------------------- OB History   Gravidity:    3         Term:   2  Living:       2 ---------------------------------------------------------------------- Fetal Evaluation  Num Of Fetuses:     1  Fetal Heart         148  Rate(bpm):  Cardiac Activity:   Observed  Presentation:       Cephalic  Placenta:  Posterior, above cervical os  P. Cord Insertion:  Previously Visualized  Amniotic Fluid  AFI FV:      Subjectively within normal limits  AFI Sum(cm)     %Tile       Largest Pocket(cm)  13.08           41          5.57  RUQ(cm)       RLQ(cm)       LUQ(cm)        LLQ(cm)  5.57          2.99          2.11           2.41 ---------------------------------------------------------------------- Biometry  BPD:        83  mm     G. Age:  33w 3d         44  %    CI:        77.57   %    70 - 86                                                          FL/HC:      21.3   %    19.9 - 21.5  HC:      298.3  mm     G. Age:  33w 0d         10  %    HC/AC:      0.97        0.96 - 1.11  AC:      306.4  mm     G. Age:  34w 4d         82  %    FL/BPD:     76.5   %    71 - 87  FL:       63.5  mm     G. Age:  32w 6d         24  %    FL/AC:      20.7   %    20 - 24  HUM:      55.7  mm     G. Age:  32w 3d         37  %  Est. FW:    2284  gm      5 lb 1 oz     67  % ---------------------------------------------------------------------- Gestational Age  LMP:           33w 3d        Date:  10/15/16                 EDD:   07/22/17  U/S Today:     33w 3d                                        EDD:   07/22/17  Best:          33w 3d     Det. By:  LMP  (10/15/16)          EDD:   07/22/17 ---------------------------------------------------------------------- Anatomy  Cranium:  Appears normal         Aortic Arch:            Previously seen  Cavum:                 Appears normal         Ductal Arch:            Previously seen  Ventricles:            Appears normal         Diaphragm:              Appears normal  Choroid Plexus:        Previously seen        Stomach:                 Appears normal, left                                                                        sided  Cerebellum:            Previously seen        Abdomen:                Appears normal  Posterior Fossa:       Previously seen        Abdominal Wall:         Previously seen  Nuchal Fold:           Previously seen        Cord Vessels:           Previously seen  Face:                  Orbits prev seen;      Kidneys:                Appear normal                         profile nl  Lips:                  Appears normal         Bladder:                Appears normal  Thoracic:              Appears normal         Spine:                  Previously seen  Heart:                 Appears normal         Upper Extremities:      Previously seen                         (4CH, axis, and situs  RVOT:                  Appears normal         Lower Extremities:      Previously seen  LVOT:  Appears normal  Other:  Fetus appears to be a female. Heels and 5th digit prev visualized. ---------------------------------------------------------------------- Cervix Uterus Adnexa  Cervix  Length:            4.6  cm.  Normal appearance by transvaginal scan ---------------------------------------------------------------------- Impression  SIUP at 33+3 weeks  Normal interval anatomy; anatomic survey complete  Normal amniotic fluid volume  Appropriate interval growth with EFW at the 67th %tile  EV views of cervix: normal length without funneling; inferior  edge of posterior placenta ends 4.1 cms from internal os  Posterior placenta; no previa ---------------------------------------------------------------------- Recommendations  Follow-up as clinically indicated ----------------------------------------------------------------------                 Particia Nearing, MD Electronically Signed Final Report   06/06/2017 06:35 pm ----------------------------------------------------------------------  [place] Mfm Ob Follow Up  Result  Date: 06/06/2017 ----------------------------------------------------------------------  OBSTETRICS REPORT                      (Signed Final 06/06/2017 06:35 pm) ---------------------------------------------------------------------- Patient Info  ID #:       [id number]                          D.O.B.:  06-Jul-1985 (31 yrs)  Name:       Amber Garrison                    Visit Date: 06/06/2017 08:35 am ---------------------------------------------------------------------- Performed By  Performed By:     Lenise Arena        Ref. Address:     [facility]                                                             OB/Gyn Clinic                                                             [address]                                                             [place], [place]  [address]  Attending:        Particia Nearing MD       Location:         [facility]  Referred By:      [facility] for                    [facility]                    Healthcare ---------------------------------------------------------------------- Orders   #  Description                                 Code   1  [place] MFM [facility] TRANSVAGINAL                      46962.9   2  [place] MFM OB FOLLOW UP                         52841.32  ----------------------------------------------------------------------   #  Ordered By               Order #        Accession #    Episode #   1  Particia Nearing            [id number]      [id number]     [id number]   2  MARTHA DECKER            [id number]      [id number]     [id number]  ---------------------------------------------------------------------- Indications   [redacted] weeks gestation of pregnancy                Z3A.33   Pregnancy resulting from assisted              O79.819   reproductive technology (IVF)   Encounter for other antenatal screening         Z36.2   follow-up   Low lying placenta, antepartum                 O44.40  ---------------------------------------------------------------------- OB History  Gravidity:    3         Term:   2  Living:       2 ---------------------------------------------------------------------- Fetal Evaluation  Num Of Fetuses:     1  Fetal Heart         148  Rate(bpm):  Cardiac Activity:   Observed  Presentation:       Cephalic  Placenta:           Posterior, above cervical os  P. Cord Insertion:  Previously Visualized  Amniotic Fluid  AFI FV:      Subjectively within normal limits  AFI Sum(cm)     %Tile       Largest Pocket(cm)  13.08           41          5.57  RUQ(cm)       RLQ(cm)       LUQ(cm)        LLQ(cm)  5.57          2.99          2.11           2.41 ----------------------------------------------------------------------  Biometry  BPD:        83  mm     G. Age:  33w 3d         44  %    CI:        77.57   %    70 - 86                                                          FL/HC:      21.3   %    19.9 - 21.5  HC:      298.3  mm     G. Age:  33w 0d         10  %    HC/AC:      0.97        0.96 - 1.11  AC:      306.4  mm     G. Age:  34w 4d         82  %    FL/BPD:     76.5   %    71 - 87  FL:       63.5  mm     G. Age:  32w 6d         24  %    FL/AC:      20.7   %    20 - 24  HUM:      55.7  mm     G. Age:  32w 3d         37  %  Est. FW:    2284  gm      5 lb 1 oz     67  % ---------------------------------------------------------------------- Gestational Age  LMP:           33w 3d        Date:  10/15/16                 EDD:   07/22/17  U/S Today:     33w 3d                                        EDD:   07/22/17  Best:          33w 3d     Det. By:  LMP  (10/15/16)          EDD:   07/22/17 ---------------------------------------------------------------------- Anatomy  Cranium:               Appears normal         Aortic Arch:            Previously seen  Cavum:                 Appears normal         Ductal Arch:             Previously seen  Ventricles:            Appears normal         Diaphragm:              Appears normal  Choroid Plexus:  Previously seen        Stomach:                Appears normal, left                                                                        sided  Cerebellum:            Previously seen        Abdomen:                Appears normal  Posterior Fossa:       Previously seen        Abdominal Wall:         Previously seen  Nuchal Fold:           Previously seen        Cord Vessels:           Previously seen  Face:                  Orbits prev seen;      Kidneys:                Appear normal                         profile nl  Lips:                  Appears normal         Bladder:                Appears normal  Thoracic:              Appears normal         Spine:                  Previously seen  Heart:                 Appears normal         Upper Extremities:      Previously seen                         (4CH, axis, and situs  RVOT:                  Appears normal         Lower Extremities:      Previously seen  LVOT:                  Appears normal  Other:  Fetus appears to be a female. Heels and 5th digit prev visualized. ---------------------------------------------------------------------- Cervix Uterus Adnexa  Cervix  Length:            4.6  cm.  Normal appearance by transvaginal scan ---------------------------------------------------------------------- Impression  SIUP at 33+3 weeks  Normal interval anatomy; anatomic survey complete  Normal amniotic fluid volume  Appropriate interval growth with EFW at the 67th %tile  EV views of cervix: normal length without funneling; inferior  edge of posterior placenta ends 4.1 cms from internal os  Posterior placenta; no previa ---------------------------------------------------------------------- Recommendations  Follow-up as clinically indicated ----------------------------------------------------------------------  Particia Nearing, MD  Electronically Signed Final Report   06/06/2017 06:35 pm ----------------------------------------------------------------------   Assessment and Plan:  Pregnancy: [id number] at [redacted]w[redacted]d  1. Supervision of other normal pregnancy, antepartum Preterm labor symptoms and general obstetric precautions including but not limited to vaginal bleeding, contractions, leaking of fluid and fetal movement were reviewed in detail with the patient. Please refer to After Visit Summary for other counseling recommendations.  Return in about 2 weeks (around 06/26/2017) for OB Visit, Pelvic cultures.  No future appointments.  Jaynie Collins, MD

## 2017-06-12 NOTE — Patient Instructions (Signed)
Return to clinic for any scheduled appointments or obstetric concerns, or go to MAU for evaluation  

## 2017-06-27 ENCOUNTER — Other Ambulatory Visit (HOSPITAL_COMMUNITY)
Admission: RE | Admit: 2017-06-27 | Discharge: 2017-06-27 | Disposition: A | Discharge status: Home / Self Care | Payer: Managed Care, Other (non HMO) | Source: Ambulatory Visit | Attending: Family Medicine | Admitting: Family Medicine

## 2017-06-27 ENCOUNTER — Ambulatory Visit (INDEPENDENT_AMBULATORY_CARE_PROVIDER_SITE_OTHER): Payer: Managed Care, Other (non HMO) | Admitting: Family Medicine

## 2017-06-27 VITALS — BP 106/69 | HR 111 | Wt 141.1 lb

## 2017-06-27 DIAGNOSIS — Z3480 Encounter for supervision of other normal pregnancy, unspecified trimester: Secondary | ICD-10-CM | POA: Diagnosis not present

## 2017-06-27 LAB — OB RESULTS CONSOLE GC/CHLAMYDIA: GC PROBE AMP, GENITAL: NEGATIVE

## 2017-06-27 LAB — OB RESULTS CONSOLE GBS
GBS: NEGATIVE
GBS: POSITIVE

## 2017-06-27 MED ORDER — FERROUS GLUCONATE 324 (38 FE) MG PO TABS: 324.0000 mg | ORAL_TABLET | Freq: Two times a day (BID) | ORAL | 3 refills | Status: DC

## 2017-06-27 NOTE — Progress Notes (Signed)
   PRENATAL VISIT NOTE  Subjective:  Amber Garrison is a 32 y.o. G3P2002 at [redacted]w[redacted]d being seen today for ongoing prenatal care.  She is currently monitored for the following issues for this low-risk pregnancy and has Supervision of other normal pregnancy, antepartum; Infertility associated with anovulation; PCOS (polycystic ovarian syndrome); Rh negative state in antepartum period; Chronic tension-type headache, not intractable; Current moderate episode of major depressive disorder without prior episode (HCC); GAD (generalized anxiety disorder); and Gastroesophageal reflux on their problem list.  Patient reports occasional contractions.  Contractions: Not present. Vag. Bleeding: None.  Movement: Present. Denies leaking of fluid.   The following portions of the patient's history were reviewed and updated as appropriate: allergies, current medications, past family history, past medical history, past social history, past surgical history and problem list. Problem list updated.  Objective:   Vitals:   06/27/17 1056  BP: 106/69  Pulse: (!) 111  Weight: 141 lb 1.6 oz (64 kg)    Fetal Status: Fetal Heart Rate (bpm): 141   Movement: Present     General:  Alert, oriented and cooperative. Patient is in no acute distress.  Skin: Skin is warm and dry. No rash noted.   Cardiovascular: Normal heart rate noted  Respiratory: Normal respiratory effort, no problems with respiration noted  Abdomen: Soft, gravid, appropriate for gestational age.  Pain/Pressure: Absent     Pelvic: Cervical exam deferred        Extremities: Normal range of motion.  Edema: None  Mental Status: Normal mood and affect. Normal behavior. Normal judgment and thought content.   Assessment and Plan:  Pregnancy: G3P2002 at [redacted]w[redacted]d  1. Supervision of other normal pregnancy, antepartum FHT and FH normal. Was told she was anemic at [facility] ("9.8 or something"). Iron replacement given.  - Culture, beta strep (group b only) - GC/Chlamydia  probe amp ([place])not at [facility]  Preterm labor symptoms and general obstetric precautions including but not limited to vaginal bleeding, contractions, leaking of fluid and fetal movement were reviewed in detail with the patient. Please refer to After Visit Summary for other counseling recommendations.  No follow-ups on file.  Future Appointments  Date Time Provider Department Center  07/11/2017  9:45 AM Allie Bossier, MD [facility] None    Levie Heritage, DO

## 2017-06-28 LAB — GC/CHLAMYDIA PROBE AMP (~~LOC~~) NOT AT ARMC
CHLAMYDIA, DNA PROBE: NEGATIVE
Neisseria Gonorrhea: NEGATIVE

## 2017-07-02 LAB — CULTURE, BETA STREP (GROUP B ONLY): Strep Gp B Culture: POSITIVE — AB

## 2017-07-11 ENCOUNTER — Ambulatory Visit (INDEPENDENT_AMBULATORY_CARE_PROVIDER_SITE_OTHER): Payer: Managed Care, Other (non HMO) | Admitting: Obstetrics & Gynecology

## 2017-07-11 VITALS — BP 98/61 | HR 108 | Wt 143.1 lb

## 2017-07-11 DIAGNOSIS — Z3480 Encounter for supervision of other normal pregnancy, unspecified trimester: Secondary | ICD-10-CM

## 2017-07-17 ENCOUNTER — Other Ambulatory Visit: Payer: Self-pay

## 2017-07-17 ENCOUNTER — Inpatient Hospital Stay (HOSPITAL_COMMUNITY)
Admission: AD | Admit: 2017-07-17 | Discharge: 2017-07-19 | DRG: 807 | Disposition: A | Discharge status: Home / Self Care | Payer: Managed Care, Other (non HMO) | Attending: Obstetrics & Gynecology | Admitting: Obstetrics & Gynecology

## 2017-07-17 DIAGNOSIS — K219 Gastro-esophageal reflux disease without esophagitis: Secondary | ICD-10-CM | POA: Diagnosis present

## 2017-07-17 DIAGNOSIS — G44229 Chronic tension-type headache, not intractable: Secondary | ICD-10-CM | POA: Diagnosis present

## 2017-07-17 DIAGNOSIS — Z3A39 39 weeks gestation of pregnancy: Secondary | ICD-10-CM

## 2017-07-17 DIAGNOSIS — Z6791 Unspecified blood type, Rh negative: Secondary | ICD-10-CM

## 2017-07-17 DIAGNOSIS — Z3480 Encounter for supervision of other normal pregnancy, unspecified trimester: Secondary | ICD-10-CM

## 2017-07-17 DIAGNOSIS — O99824 Streptococcus B carrier state complicating childbirth: Principal | ICD-10-CM | POA: Diagnosis present

## 2017-07-17 DIAGNOSIS — O9962 Diseases of the digestive system complicating childbirth: Secondary | ICD-10-CM | POA: Diagnosis present

## 2017-07-17 DIAGNOSIS — N970 Female infertility associated with anovulation: Secondary | ICD-10-CM | POA: Diagnosis present

## 2017-07-17 DIAGNOSIS — O26893 Other specified pregnancy related conditions, third trimester: Secondary | ICD-10-CM | POA: Diagnosis present

## 2017-07-17 DIAGNOSIS — E282 Polycystic ovarian syndrome: Secondary | ICD-10-CM | POA: Diagnosis present

## 2017-07-17 DIAGNOSIS — O26899 Other specified pregnancy related conditions, unspecified trimester: Secondary | ICD-10-CM

## 2017-07-17 HISTORY — DX: Anemia, unspecified: D64.9

## 2017-07-17 NOTE — MAU Note (Signed)
Pt presents to MAU c/o ctx every 3-5 min. Pt states her ctx started around 2100. Pt then states she thinks her water broke around 2130 but is unsure. She stated when she went to put her child to bed she noticed a pop and then had some clear fluid and it is still coming out. Pt reports +FM. Pt plans a water birth.

## 2017-07-17 NOTE — H&P (Addendum)
LABOR ADMISSION HISTORY AND PHYSICAL  Amber Garrison is a 32 y.o. female G3P2002 with IUP at 4533w2d by IVF presenting for SROM at 2030. She reports +FMs, no VB, no blurry vision, headaches or peripheral edema, and RUQ pain.  She plans on breast feeding. She request NPF for birth control.  Dating: By IVF --->  Estimated Date of Delivery: 07/22/17  [facility] Patient Active Problem List   Diagnosis Date Noted  . Gastroesophageal reflux 04/23/2017  . Rh negative state in antepartum period 02/16/2017  . Supervision of other normal pregnancy, antepartum 02/14/2017  . Infertility associated with anovulation 02/14/2017  . PCOS (polycystic ovarian syndrome) 02/14/2017  . Chronic tension-type headache, not intractable 06/04/2016  . Current moderate episode of major depressive disorder without prior episode (HCC) 06/04/2016  . GAD (generalized anxiety disorder) 06/04/2016  Low Lying Placenta, resolved   Past Medical History: Past Medical History:  Diagnosis Date  . Medical history non-contributory     Past Surgical History: Past Surgical History:  Procedure Laterality Date  . NO PAST SURGERIES      Obstetrical History: OB History    Gravida  3   Para  2   Term  2   Preterm      AB      Living  2     SAB      TAB      Ectopic      Multiple      Live Births  2           Social History: Social History   Socioeconomic History  . Marital status: Married    Spouse name: Not on file  . Number of children: Not on file  . Years of education: Not on file  . Highest education level: Not on file  Occupational History  . Not on file  Social Needs  . Financial resource strain: Not on file  . Food insecurity:    Worry: Not on file    Inability: Not on file  . Transportation needs:    Medical: Not on file    Non-medical: Not on file  Tobacco Use  . Smoking status: Never Smoker  . Smokeless tobacco: Never Used  Substance and  Sexual Activity  . Alcohol use: No    Frequency: Never  . Drug use: No  . Sexual activity: Yes    Birth control/protection: None  Lifestyle  . Physical activity:    Days per week: Not on file    Minutes per session: Not on file  . Stress: Not on file  Relationships  . Social connections:    Talks on phone: Not on file    Gets together: Not on file    Attends religious service: Not on file    Active member of club or organization: Not on file    Attends meetings of clubs or organizations: Not on file    Relationship status: Not on file  Other Topics Concern  . Not on file  Social History Narrative  . Not on file    Family History: Family History  Problem Relation Age of Onset  . Hypertension Mother     Allergies: No Known Allergies  Medications Prior to Admission  Medication Sig Dispense Refill Last Dose  . calcium carbonate (TUMS - DOSED IN MG ELEMENTAL CALCIUM) 500 MG chewable tablet Chew 1 tablet by mouth daily.   Taking  . ferrous gluconate (FERGON) 324 MG tablet Take 1 tablet (  324 mg total) by mouth 2 (two) times daily with a meal. 60 tablet 3 Taking  . Prenatal Vit-Fe Fumarate-FA (MULTIVITAMIN-PRENATAL) 27-0.8 MG TABS tablet Take 1 tablet by mouth daily at 12 noon.   Taking    Review of Systems   All systems reviewed and negative except as stated in HPI  Physical Exam Blood pressure 117/66, pulse 96, temperature 97.7 F (36.5 C), temperature source Oral, resp. rate 18, weight 145 lb (65.8 kg), last menstrual period 10/15/2016. General appearance: alert, cooperative and no distress HEENT: no JVD, MMM Abdomen: soft, non-tender; bowel sounds normal Extremities: Homans sign is negative, no sign of DVT Presentation: cephalic Fetal monitoringBaseline: 140 bpm, Variability: Good {> 6 bpm), Accelerations: Reactive and Decelerations: Absent Uterine activityFrequency: Every 2-7 minutes     Prenatal labs: ABO, Rh: --/Negative/-- (03/26 0901) Antibody: Negative  (01/17 0947) Rubella: 3.10 (01/17 0947) RPR: Non Reactive (03/26 0901)  HBsAg: Negative (01/17 0947)  HIV: Non Reactive (03/26 0901)  GBS:   Pos 2 hr Glucola wnl  Prenatal Transfer Tool  Maternal Diabetes: No Genetic Screening: Normal Maternal Ultrasounds/Referrals: Normal Fetal Ultrasounds or other Referrals:  None Maternal Substance Abuse:  No Significant Maternal Medications:  None Significant Maternal Lab Results: Lab values include: Group B Strep positive, Rh negative  No results found for this or any previous visit (from the past 24 hour(s)).  Patient Active Problem List   Diagnosis Date Noted  . Gastroesophageal reflux 04/23/2017  . Rh negative state in antepartum period 02/16/2017  . Supervision of other normal pregnancy, antepartum 02/14/2017  . Infertility associated with anovulation 02/14/2017  . PCOS (polycystic ovarian syndrome) 02/14/2017  . Chronic tension-type headache, not intractable 06/04/2016  . Current moderate episode of major depressive disorder without prior episode (HCC) 06/04/2016  . GAD (generalized anxiety disorder) 06/04/2016    Assessment: Amber Garrison is a 32 y.o. G3P2002 at [redacted]w[redacted]d here for SROM  #Labor:not in active labor, planing water birth, has attended class, consented at bedside #Pain: supportive #FWB: Cat I #ID:  GBS pos, start PCN #MOF: Breast #MOC:NFP #Circ:  N/A  Thomes Dinning, MD, MS FAMILY MEDICINE RESIDENT - PGY1 07/17/2017 11:48 PM  CNM attestation:  I have seen and examined this patient; I agree with above documentation in the resident's note.   Amber Garrison is a 32 y.o. [phone] here for SROM @ 2030/onset early labor  PE: BP 117/69   Pulse 89   Temp 98 F (36.7 C) (Oral)   Resp 16   Ht 5\' 4"  (1.626 m)   Wt 65.8 kg (145 lb)   LMP 10/15/2016 (Exact Date)   BMI 24.89 kg/m  Gen: starting to feel uncomfortable w/ ctx Resp: normal effort, no distress Abd: gravid  ROS, labs, PMH reviewed  Plan: Admit to  [facility] Expectant management PCN for GBS ppx Plans waterbirth- consented at bedside Anticipate SVD Plan PP Rhogam eval  Amber Garrison CNM 07/18/2017, 1:16 AM

## 2017-07-18 ENCOUNTER — Encounter: Payer: Managed Care, Other (non HMO) | Admitting: Obstetrics and Gynecology

## 2017-07-18 ENCOUNTER — Encounter (HOSPITAL_COMMUNITY): Payer: Self-pay | Admitting: *Deleted

## 2017-07-18 DIAGNOSIS — O99824 Streptococcus B carrier state complicating childbirth: Secondary | ICD-10-CM | POA: Diagnosis present

## 2017-07-18 DIAGNOSIS — K219 Gastro-esophageal reflux disease without esophagitis: Secondary | ICD-10-CM | POA: Diagnosis present

## 2017-07-18 DIAGNOSIS — O9962 Diseases of the digestive system complicating childbirth: Secondary | ICD-10-CM | POA: Diagnosis present

## 2017-07-18 DIAGNOSIS — Z6791 Unspecified blood type, Rh negative: Secondary | ICD-10-CM | POA: Diagnosis not present

## 2017-07-18 DIAGNOSIS — O26893 Other specified pregnancy related conditions, third trimester: Secondary | ICD-10-CM | POA: Diagnosis present

## 2017-07-18 DIAGNOSIS — Z3A39 39 weeks gestation of pregnancy: Secondary | ICD-10-CM

## 2017-07-18 DIAGNOSIS — Z3483 Encounter for supervision of other normal pregnancy, third trimester: Secondary | ICD-10-CM | POA: Diagnosis present

## 2017-07-18 LAB — CBC
HEMATOCRIT: 31.9 % — AB (ref 36.0–46.0)
HEMOGLOBIN: 10.4 g/dL — AB (ref 12.0–15.0)
MCH: 26.3 pg (ref 26.0–34.0)
MCHC: 32.6 g/dL (ref 30.0–36.0)
MCV: 80.6 fL (ref 78.0–100.0)
Platelets: 173 10*3/uL (ref 150–400)
RBC: 3.96 MIL/uL (ref 3.87–5.11)
RDW: 16.6 % — AB (ref 11.5–15.5)
WBC: 8 10*3/uL (ref 4.0–10.5)

## 2017-07-18 LAB — POCT FERN TEST: POCT FERN TEST: POSITIVE

## 2017-07-18 LAB — TYPE AND SCREEN
ABO/RH(D): A NEG
Antibody Screen: NEGATIVE

## 2017-07-18 LAB — ABO/RH: ABO/RH(D): A NEG

## 2017-07-18 LAB — RPR: RPR Ser Ql: NONREACTIVE

## 2017-07-18 MED ORDER — ONDANSETRON HCL 4 MG/2ML IJ SOLN: 4.0000 mg | INTRAMUSCULAR | Status: DC | PRN

## 2017-07-18 MED ORDER — PRENATAL MULTIVITAMIN CH
1.0000 | ORAL_TABLET | Freq: Every day | ORAL | Status: DC
  Administered 2017-07-18: 1 via ORAL
  Filled 2017-07-18: qty 1

## 2017-07-18 MED ORDER — ONDANSETRON HCL 4 MG/2ML IJ SOLN
4.0000 mg | Freq: Four times a day (QID) | INTRAMUSCULAR | Status: DC | PRN
Start: 2017-07-18 — End: 2017-07-18

## 2017-07-18 MED ORDER — FENTANYL CITRATE (PF) 100 MCG/2ML IJ SOLN: 100.0000 ug | INTRAMUSCULAR | Status: DC | PRN

## 2017-07-18 MED ORDER — ACETAMINOPHEN 325 MG PO TABS
650.0000 mg | ORAL_TABLET | ORAL | Status: DC | PRN
Start: 2017-07-18 — End: 2017-07-18

## 2017-07-18 MED ORDER — TETANUS-DIPHTH-ACELL PERTUSSIS 5-2.5-18.5 LF-MCG/0.5 IM SUSP: 0.5000 mL | Freq: Once | INTRAMUSCULAR | Status: DC

## 2017-07-18 MED ORDER — SODIUM CHLORIDE 0.9 % IV SOLN
5.0000 10*6.[IU] | Freq: Once | INTRAVENOUS | Status: AC
  Administered 2017-07-18: 5 10*6.[IU] via INTRAVENOUS
  Filled 2017-07-18: qty 5

## 2017-07-18 MED ORDER — DIBUCAINE 1 % RE OINT: 1.0000 "application " | TOPICAL_OINTMENT | RECTAL | Status: DC | PRN

## 2017-07-18 MED ORDER — RHO D IMMUNE GLOBULIN 1500 UNIT/2ML IJ SOSY
300.0000 ug | PREFILLED_SYRINGE | Freq: Once | INTRAMUSCULAR | Status: AC
  Administered 2017-07-18: 300 ug via INTRAVENOUS
  Filled 2017-07-18: qty 2

## 2017-07-18 MED ORDER — SIMETHICONE 80 MG PO CHEW: 80.0000 mg | CHEWABLE_TABLET | ORAL | Status: DC | PRN

## 2017-07-18 MED ORDER — ZOLPIDEM TARTRATE 5 MG PO TABS: 5.0000 mg | ORAL_TABLET | Freq: Every evening | ORAL | Status: DC | PRN

## 2017-07-18 MED ORDER — OXYCODONE HCL 5 MG PO TABS: 5.0000 mg | ORAL_TABLET | ORAL | Status: DC | PRN

## 2017-07-18 MED ORDER — OXYTOCIN 40 UNITS IN LACTATED RINGERS INFUSION - SIMPLE MED
2.5000 [IU]/h | INTRAVENOUS | Status: DC
  Filled 2017-07-18: qty 1000

## 2017-07-18 MED ORDER — SOD CITRATE-CITRIC ACID 500-334 MG/5ML PO SOLN
30.0000 mL | ORAL | Status: DC | PRN
Start: 2017-07-18 — End: 2017-07-18

## 2017-07-18 MED ORDER — OXYCODONE-ACETAMINOPHEN 5-325 MG PO TABS: 2.0000 | ORAL_TABLET | ORAL | Status: DC | PRN

## 2017-07-18 MED ORDER — DIPHENHYDRAMINE HCL 25 MG PO CAPS: 25.0000 mg | ORAL_CAPSULE | Freq: Four times a day (QID) | ORAL | Status: DC | PRN

## 2017-07-18 MED ORDER — LACTATED RINGERS IV SOLN: 500.0000 mL | INTRAVENOUS | Status: DC | PRN

## 2017-07-18 MED ORDER — PENICILLIN G POT IN DEXTROSE 60000 UNIT/ML IV SOLN
3.0000 10*6.[IU] | INTRAVENOUS | Status: DC
  Filled 2017-07-18 (×3): qty 50

## 2017-07-18 MED ORDER — OXYTOCIN BOLUS FROM INFUSION
500.0000 mL | Freq: Once | INTRAVENOUS | Status: AC
  Administered 2017-07-18: 500 mL via INTRAVENOUS

## 2017-07-18 MED ORDER — BENZOCAINE-MENTHOL 20-0.5 % EX AERO
1.0000 "application " | INHALATION_SPRAY | CUTANEOUS | Status: DC | PRN
  Administered 2017-07-18: 1 via TOPICAL
  Filled 2017-07-18: qty 56

## 2017-07-18 MED ORDER — ONDANSETRON HCL 4 MG PO TABS: 4.0000 mg | ORAL_TABLET | ORAL | Status: DC | PRN

## 2017-07-18 MED ORDER — LACTATED RINGERS IV SOLN
INTRAVENOUS | Status: DC
  Administered 2017-07-18 (×2): via INTRAVENOUS

## 2017-07-18 MED ORDER — OXYCODONE-ACETAMINOPHEN 5-325 MG PO TABS
1.0000 | ORAL_TABLET | ORAL | Status: DC | PRN
Start: 2017-07-18 — End: 2017-07-18

## 2017-07-18 MED ORDER — IBUPROFEN 600 MG PO TABS
600.0000 mg | ORAL_TABLET | Freq: Four times a day (QID) | ORAL | Status: DC
  Filled 2017-07-18 (×3): qty 1

## 2017-07-18 MED ORDER — SENNOSIDES-DOCUSATE SODIUM 8.6-50 MG PO TABS
2.0000 | ORAL_TABLET | ORAL | Status: DC
  Administered 2017-07-19: 2 via ORAL
  Filled 2017-07-18: qty 2

## 2017-07-18 MED ORDER — ACETAMINOPHEN 325 MG PO TABS
650.0000 mg | ORAL_TABLET | ORAL | Status: DC | PRN
  Administered 2017-07-19: 650 mg via ORAL
  Filled 2017-07-18: qty 2

## 2017-07-18 MED ORDER — COCONUT OIL OIL: 1.0000 "application " | TOPICAL_OIL | Status: DC | PRN

## 2017-07-18 MED ORDER — LIDOCAINE HCL (PF) 1 % IJ SOLN
30.0000 mL | INTRAMUSCULAR | Status: DC | PRN
  Filled 2017-07-18: qty 30

## 2017-07-18 MED ORDER — WITCH HAZEL-GLYCERIN EX PADS: 1.0000 "application " | MEDICATED_PAD | CUTANEOUS | Status: DC | PRN

## 2017-07-19 LAB — RH IG WORKUP (INCLUDES ABO/RH)
ABO/RH(D): A NEG
Fetal Screen: NEGATIVE
Gestational Age(Wks): 39
UNIT DIVISION: 0

## 2017-07-19 MED ORDER — IBUPROFEN 600 MG PO TABS: 600.0000 mg | ORAL_TABLET | Freq: Four times a day (QID) | ORAL | 2 refills | Status: DC | PRN

## 2017-07-19 NOTE — Discharge Summary (Signed)
OB Discharge Summary     Patient Name: Amber Garrison DOB: Oct 18, 1985 MRN: [id number]  Date of admission: 07/17/2017 Delivering MD: Cam Hai D   Date of discharge: 07/19/2017  Admitting diagnosis: 39 WKS, CTX Intrauterine pregnancy: [redacted]w[redacted]d     Secondary diagnosis:  Principal Problem:   Supervision of other normal pregnancy, antepartum Active Problems:   Infertility associated with anovulation   PCOS (polycystic ovarian syndrome)   Rh negative state in antepartum period   Chronic tension-type headache, not intractable   Gastroesophageal reflux   Normal labor  Additional problems: None     Discharge diagnosis: Term Pregnancy Delivered                                                                                                Post partum procedures: Rhogam  Augmentation: None  Complications: None  Hospital course:  Onset of Labor With Vaginal Delivery     32 y.o. yo [id number] at [redacted]w[redacted]d was admitted in Active Labor on 07/17/2017. Patient had an uncomplicated labor course as follows:  Membrane Rupture Time/Date: 9:00 PM ,07/17/2017   Intrapartum Procedures: Episiotomy: None [1]                                         Lacerations:  None [1]  Patient had a delivery of a Viable infant. 07/18/2017  Information for the patient's newborn:  Margalit, Leece [[id number]]  Delivery Method: Vag-Spont    Pateint had an uncomplicated postpartum course.  She is ambulating, tolerating a regular diet, passing flatus, and urinating well. Patient is discharged home in stable condition on 07/19/17.   Physical exam  Vitals:   07/18/17 1608 07/18/17 2130 07/19/17 0458 07/19/17 0744  BP: 107/62 106/70 103/62 98/60  Pulse: 78 87 92 95  Resp: 18 18 16 18   Temp: 98.4 F (36.9 C) 97.9 F (36.6 C) 97.9 F (36.6 C) 97.9 F (36.6 C)  TempSrc: Oral Oral Oral Oral  SpO2: 100% 100% 100% 100%  Weight:      Height:       General: alert, cooperative and no distress Lochia:  appropriate Uterine Fundus: firm Incision: N/A DVT Evaluation: No evidence of DVT seen on physical exam. Negative Homan's sign. No cords or calf tenderness. No significant calf/ankle edema. Labs: Lab Results  Component Value Date   WBC 8.0 07/18/2017   HGB 10.4 (L) 07/18/2017   HCT 31.9 (L) 07/18/2017   MCV 80.6 07/18/2017   PLT 173 07/18/2017   No flowsheet data found.  Discharge instruction: per After Visit Summary and "Baby and Me Booklet".  After visit meds:  Allergies as of 07/19/2017   No Known Allergies     Medication List    TAKE these medications   calcium carbonate 500 MG chewable tablet Commonly known as:  TUMS - dosed in mg elemental calcium Chew 1 tablet by mouth daily.   ferrous gluconate 324 MG tablet Commonly known as:  FERGON Take 1 tablet (324 mg total) by mouth  2 (two) times daily with a meal.   ibuprofen 600 MG tablet Commonly known as:  ADVIL,MOTRIN Take 1 tablet (600 mg total) by mouth every 6 (six) hours as needed for moderate pain or cramping.   multivitamin-prenatal 27-0.8 MG Tabs tablet Take 1 tablet by mouth daily at 12 noon.       Diet: routine diet  Activity: Advance as tolerated. Pelvic rest for 6 weeks.   Outpatient follow up:4 weeks  Postpartum contraception: Natural Family Planning  Newborn Data: Live born female  Birth Weight: 8 lb 3.7 oz (3734 g) APGAR: 9, 9  Newborn Delivery   Birth date/time:  07/18/2017 01:42:00 Delivery type:  Vaginal, Spontaneous     Baby Feeding: Breast Disposition:home with mother   07/19/2017 Amber CollinsUgonna Emeril Stille, MD

## 2017-07-19 NOTE — Progress Notes (Signed)
Teaching complete  Home at 4 pm with baby

## 2017-07-19 NOTE — Lactation Note (Addendum)
This note was copied from a baby's chart. Lactation Consultation Note  Patient Name: Amber Harvie BridgeChristy Garrison ZOXWR'UToday's Date: 07/19/2017 Reason for consult: Initial assessment;Other (Comment);Maternal endocrine disorder;Term(IVF) Type of Endocrine Disorder?: PCOS  35 hours old FT female who is being exclusively BF by her mother' she's a P3 and experienced BF. She was able to BF her second child for 23 months and she already knows how to hand express. Mom has Hx of PCOS and IVF but mom has a Medela DEBP that she got through her insurance and she's willing to pump if needed. Milk supply was not an issue before though, mom even got engorged with her last baby, engorgement prevention and treatment was also discussed. Dad present and very supportive, both mom and dad had lost of questions and comments about BF, mom voiced she wished she knew all she learnt during her hospital stay about BF when she had her first baby 12 years ago.  Mom and baby are going home today, they may be held though due to mom's GBS (+) status. Baby was resting on bobby pillow next to mom when entering the room, offered assistance with latch but per mom, baby just fed and she was getting ready to burp her. Baby had the hiccups. Per mom feedings at the breast are comfortable and she's able to hear swallows; baby has a strong suck. Mom self reported her nipples are intact with no sign of trauma.   Reviewed discharge instructions on when to call baby's pediatrician, encouraged to feed baby STS 8-12 times/24 hours or sooner if feeding cues are present. BF brochure, BF resources and feeding diary were reviewed; parents reported all questions were answered, they're both aware of LC OP services and will contact if needed.  Maternal Data Formula Feeding for Exclusion: No Has patient been taught Hand Expression?: Yes Does the patient have breastfeeding experience prior to this delivery?: Yes  Feeding   Interventions Interventions: Breast  feeding basics reviewed  Lactation Tools Discussed/Used WIC Program: Yes   Consult Status Consult Status: Complete    Abdelrahman Nair S Telesforo Brosnahan 07/19/2017, 1:02 PM

## 2017-07-19 NOTE — Discharge Instructions (Signed)
Vaginal Delivery, Care After °Refer to this sheet in the next few weeks. These instructions provide you with information about caring for yourself after vaginal delivery. Your health care provider may also give you more specific instructions. Your treatment has been planned according to current medical practices, but problems sometimes occur. Call your health care provider if you have any problems or questions. °What can I expect after the procedure? °After vaginal delivery, it is common to have: °· Some bleeding from your vagina. °· Soreness in your abdomen, your vagina, and the area of skin between your vaginal opening and your anus (perineum). °· Pelvic cramps. °· Fatigue. ° °Follow these instructions at home: °Medicines °· Take over-the-counter and prescription medicines only as told by your health care provider. °· If you were prescribed an antibiotic medicine, take it as told by your health care provider. Do not stop taking the antibiotic until it is finished. °Driving ° °· Do not drive or operate heavy machinery while taking prescription pain medicine. °· Do not drive for 24 hours if you received a sedative. °Lifestyle °· Do not drink alcohol. This is especially important if you are breastfeeding or taking medicine to relieve pain. °· Do not use tobacco products, including cigarettes, chewing tobacco, or e-cigarettes. If you need help quitting, ask your health care provider. °Eating and drinking °· Drink at least 8 eight-ounce glasses of water every day unless you are told not to by your health care provider. If you choose to breastfeed your baby, you may need to drink more water than this. °· Eat high-fiber foods every day. These foods may help prevent or relieve constipation. High-fiber foods include: °? Whole grain cereals and breads. °? Brown rice. °? Beans. °? Fresh fruits and vegetables. °Activity °· Return to your normal activities as told by your health care provider. Ask your health care provider  what activities are safe for you. °· Rest as much as possible. Try to rest or take a nap when your baby is sleeping. °· Do not lift anything that is heavier than your baby or 10 lb (4.5 kg) until your health care provider says that it is safe. °· Talk with your health care provider about when you can engage in sexual activity. This may depend on your: °? Risk of infection. °? Rate of healing. °? Comfort and desire to engage in sexual activity. °Vaginal Care °· If you have an episiotomy or a vaginal tear, check the area every day for signs of infection. Check for: °? More redness, swelling, or pain. °? More fluid or blood. °? Warmth. °? Pus or a bad smell. °· Do not use tampons or douches until your health care provider says this is safe. °· Watch for any blood clots that may pass from your vagina. These may look like clumps of dark red, brown, or black discharge. °General instructions °· Keep your perineum clean and dry as told by your health care provider. °· Wear loose, comfortable clothing. °· Wipe from front to back when you use the toilet. °· Ask your health care provider if you can shower or take a bath. If you had an episiotomy or a perineal tear during labor and delivery, your health care provider may tell you not to take baths for a certain length of time. °· Wear a bra that supports your breasts and fits you well. °· If possible, have someone help you with household activities and help care for your baby for at least a few days after   you leave the hospital. °· Keep all follow-up visits for you and your baby as told by your health care provider. This is important. °Contact a health care provider if: °· You have: °? Vaginal discharge that has a bad smell. °? Difficulty urinating. °? Pain when urinating. °? A sudden increase or decrease in the frequency of your bowel movements. °? More redness, swelling, or pain around your episiotomy or vaginal tear. °? More fluid or blood coming from your episiotomy or  vaginal tear. °? Pus or a bad smell coming from your episiotomy or vaginal tear. °? A fever. °? A rash. °? Little or no interest in activities you used to enjoy. °? Questions about caring for yourself or your baby. °· Your episiotomy or vaginal tear feels warm to the touch. °· Your episiotomy or vaginal tear is separating or does not appear to be healing. °· Your breasts are painful, hard, or turn red. °· You feel unusually sad or worried. °· You feel nauseous or you vomit. °· You pass large blood clots from your vagina. If you pass a blood clot from your vagina, save it to show to your health care provider. Do not flush blood clots down the toilet without having your health care provider look at them. °· You urinate more than usual. °· You are dizzy or light-headed. °· You have not breastfed at all and you have not had a menstrual period for 12 weeks after delivery. °· You have stopped breastfeeding and you have not had a menstrual period for 12 weeks after you stopped breastfeeding. °Get help right away if: °· You have: °? Pain that does not go away or does not get better with medicine. °? Chest pain. °? Difficulty breathing. °? Blurred vision or spots in your vision. °? Thoughts about hurting yourself or your baby. °· You develop pain in your abdomen or in one of your legs. °· You develop a severe headache. °· You faint. °· You bleed from your vagina so much that you fill two sanitary pads in one hour. °This information is not intended to replace advice given to you by your health care provider. Make sure you discuss any questions you have with your health care provider. °Document Released: 01/13/2000 Document Revised: 06/29/2015 Document Reviewed: 01/30/2015 °Elsevier Interactive Patient Education © 2018 [employer] ° °

## 2017-07-26 ENCOUNTER — Encounter: Payer: Managed Care, Other (non HMO) | Admitting: Family Medicine

## 2017-08-15 ENCOUNTER — Ambulatory Visit: Payer: Managed Care, Other (non HMO) | Admitting: Family Medicine

## 2017-08-19 ENCOUNTER — Encounter: Payer: Self-pay | Admitting: Family Medicine

## 2017-08-19 ENCOUNTER — Ambulatory Visit (INDEPENDENT_AMBULATORY_CARE_PROVIDER_SITE_OTHER): Payer: Managed Care, Other (non HMO) | Admitting: Family Medicine

## 2017-08-19 DIAGNOSIS — M9905 Segmental and somatic dysfunction of pelvic region: Secondary | ICD-10-CM

## 2017-08-19 DIAGNOSIS — M79652 Pain in left thigh: Secondary | ICD-10-CM

## 2017-08-19 DIAGNOSIS — K649 Unspecified hemorrhoids: Secondary | ICD-10-CM

## 2017-08-19 DIAGNOSIS — Z1389 Encounter for screening for other disorder: Secondary | ICD-10-CM

## 2017-08-19 DIAGNOSIS — L709 Acne, unspecified: Secondary | ICD-10-CM

## 2017-08-19 MED ORDER — CLINDAMYCIN PHOSPHATE 1 % EX GEL: Freq: Two times a day (BID) | CUTANEOUS | 11 refills | Status: AC

## 2017-08-19 MED ORDER — PRAMOXINE-HC 1-2.5 % EX OINT: 1.0000 "application " | TOPICAL_OINTMENT | Freq: Three times a day (TID) | CUTANEOUS | 1 refills | Status: AC | PRN

## 2017-08-19 NOTE — Progress Notes (Signed)
Subjective:     Amber Garrison is a 32 y.o. female who presents for a postpartum visit. She is 4 weeks postpartum following a spontaneous vaginal delivery. I have fully reviewed the prenatal and intrapartum course. The delivery was at 39 gestational weeks. Outcome: spontaneous vaginal delivery. Anesthesia: none. Postpartum course has been normal. Baby's course has been normal. Baby is feeding by breast. Bleeding no bleeding. Bowel function is normal. Bladder function is normal. Patient is not sexually active. Contraception method is rhythm method. Postpartum depression screening: negative.  Complains of acne increasing since delivery. Also has right sided rectus muscle pain and left Tensor Fascia Lata pain intermittently. Also has hemorrhoid. OTC medication not helpful.  The following portions of the patient's history were reviewed and updated as appropriate: allergies, current medications, past family history, past medical history, past social history, past surgical history and problem list.  Review of Systems Pertinent items noted in HPI and remainder of comprehensive ROS otherwise negative.   Objective:    LMP 10/15/2016 (Exact Date)   General:  alert, cooperative and no distress  Lungs: clear to auscultation bilaterally  Heart:  regular rate and rhythm, S1, S2 normal, no murmur, click, rub or gallop  Abdomen: soft, non-tender; bowel sounds normal; no masses,  no organomegaly   MSK:  tenderness to right rectus muscles  OSE: R ant innom, L/L torsion, L5 ESRL, L1 ESRR, T7-8 FSRR, Ribs 7-8 inhaled on right  Neuro: no deficits          Assessment:     Postpartum care and examination  Hemorrhoids, unspecified hemorrhoid type  Acne, unspecified acne type  Pelvic somatic dysfunction  Left thigh pain    Plan:    1. Contraception: rhythm method 2. Pramoxine 3. Clind gel  4. OMT done after patient permission. HVLA technique utilized. 5 areas treated with improvement of tissue  texture and joint mobility. Patient tolerated procedure well.   5. Follow up in: 4 months or as needed.

## 2019-04-22 IMAGING — US US MFM OB FOLLOW-UP
1 series · 14 of 28 positions shown · non-contrast
Comparison: none

[Series 1: us mfm ob follow-up · 51 acquisitions, 14 frames shown]
[im 2/51]
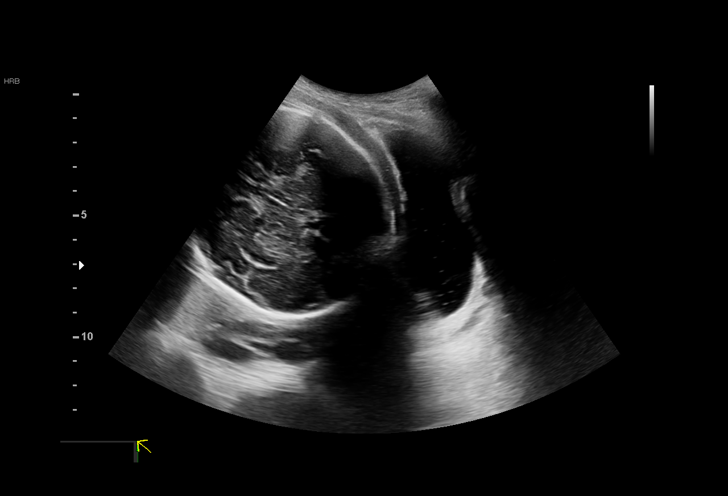
[im 6/51]
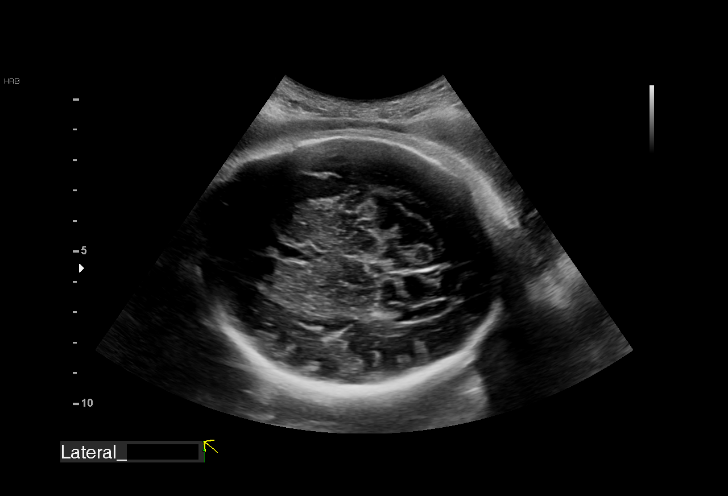
[im 10/51]
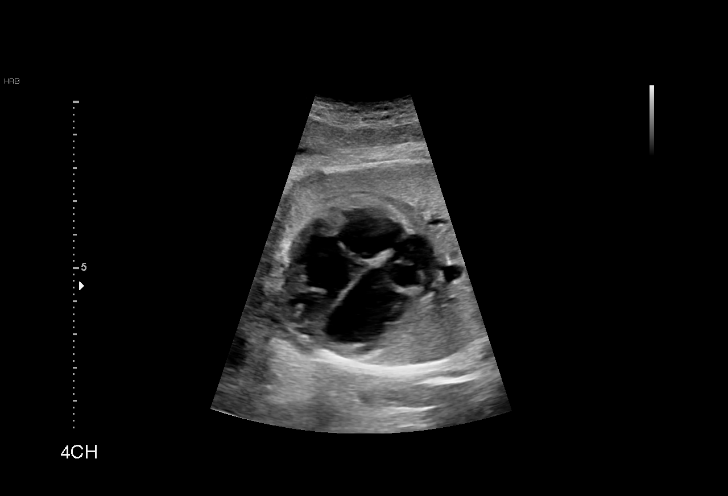
[im 13/51]
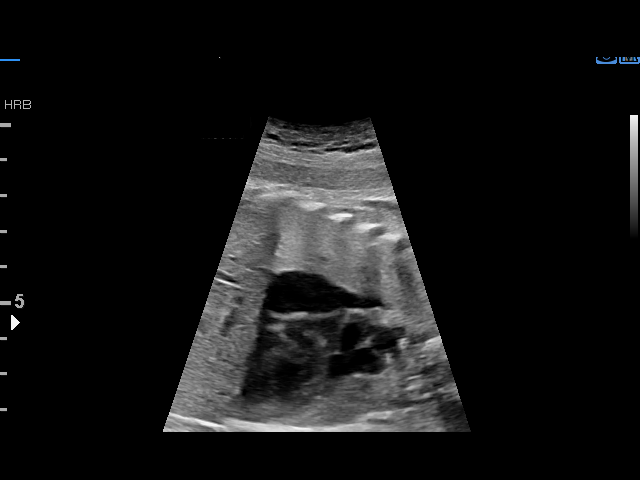
[im 17/51]
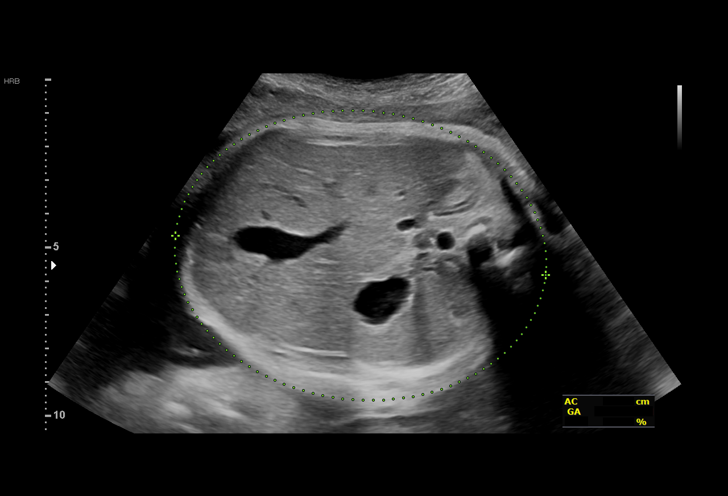
[im 21/51]
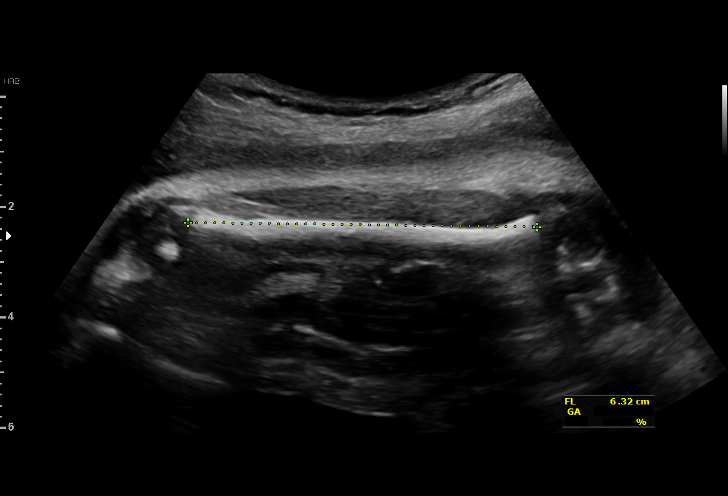
[im 25/51]
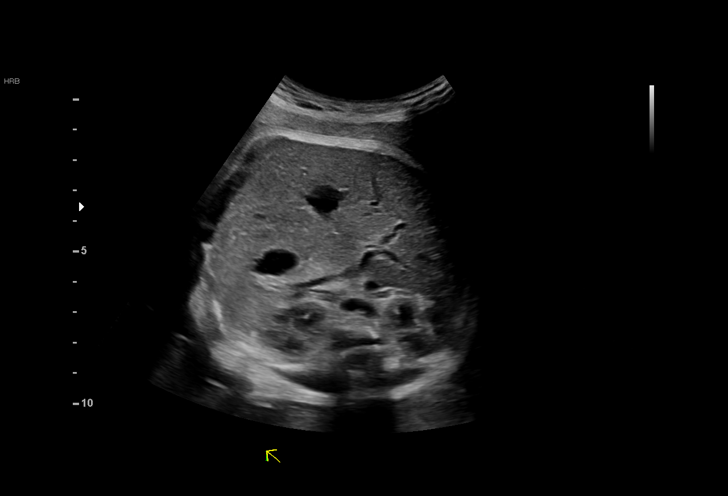
[im 28/51]
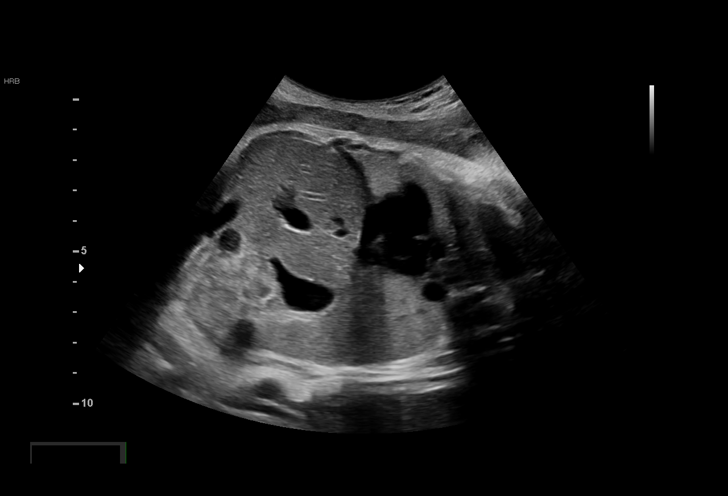
[im 32/51]
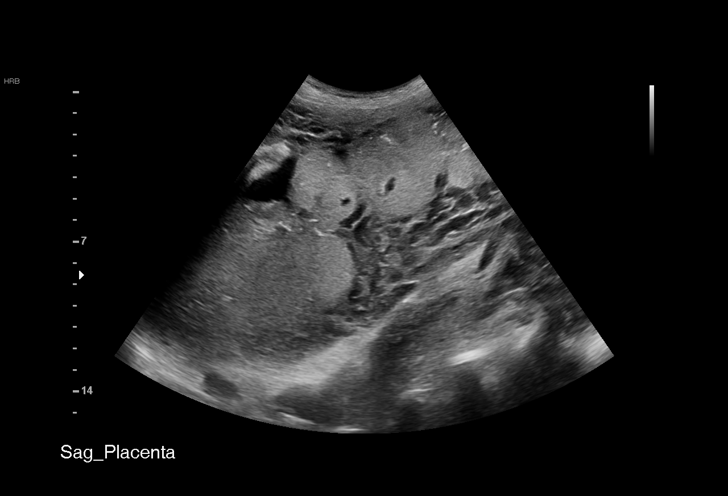
[im 36/51]
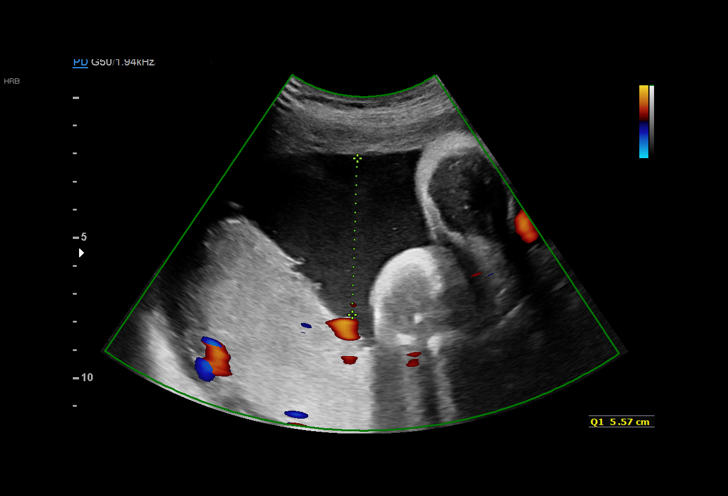
[im 39/51]
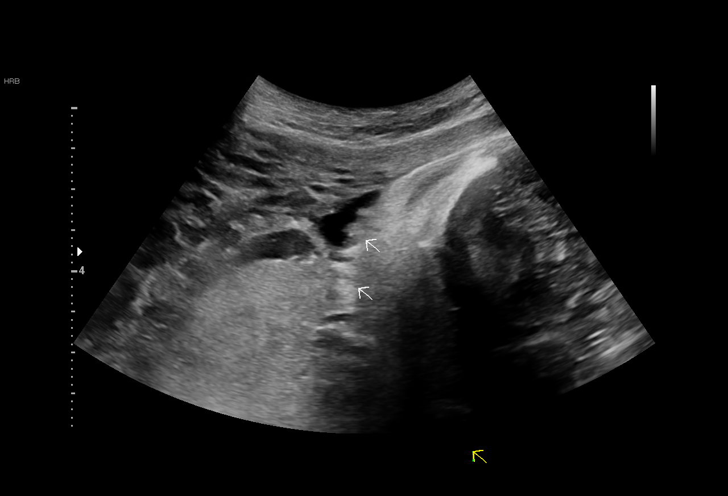
[im 43/51]
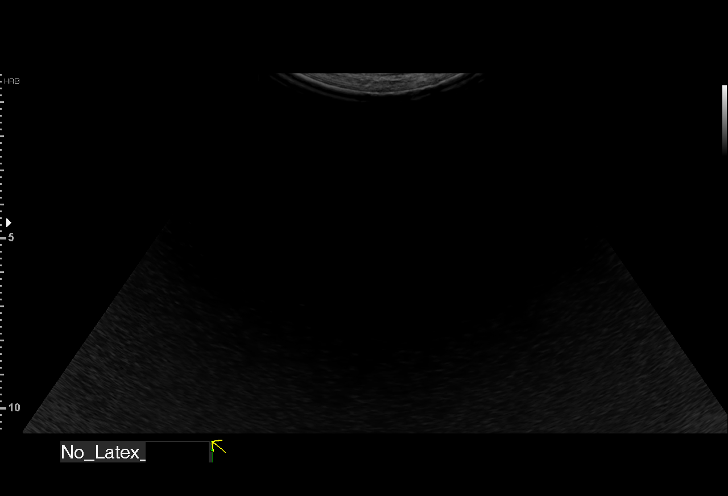
[im 47/51]
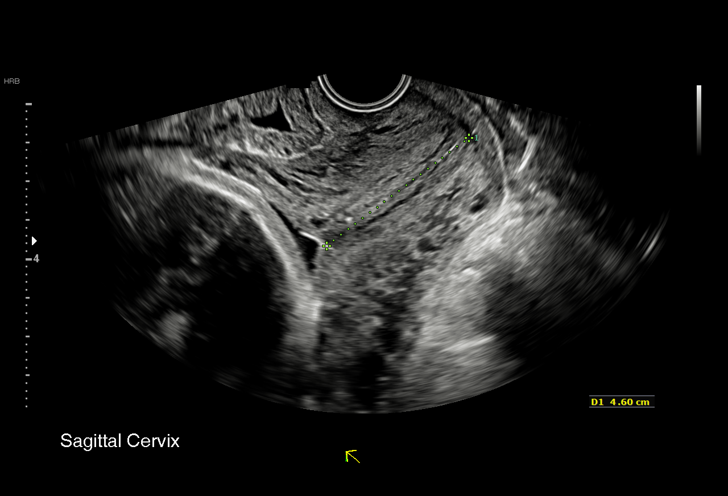
[im 51/51]
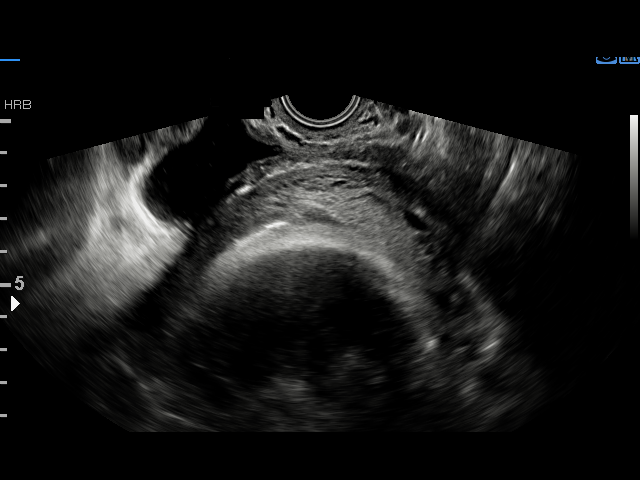

[14 of 28 positions shown; findings below may reference images not displayed]

OB/Gyn Clinic

1  LATENX SANGGAU            [id number]      [id number]     [id number]
2  LATENX SANGGAU            [id number]      [id number]     [id number]
Indications

33 weeks gestation of pregnancy
Pregnancy resulting from assisted
reproductive technology (IVF)
Encounter for other antenatal screening
follow-up
Low lying placenta, antepartum
OB History

Gravidity:    3         Term:   2
Living:       2
Fetal Evaluation

Num Of Fetuses:     1
Fetal Heart         148
Rate(bpm):
Cardiac Activity:   Observed
Presentation:       Cephalic
Placenta:           Posterior, above cervical os
P. Cord Insertion:  Previously Visualized

Amniotic Fluid
AFI FV:      Subjectively within normal limits

AFI Sum(cm)     %Tile       Largest Pocket(cm)
13.08           41
RUQ(cm)       RLQ(cm)       LUQ(cm)        LLQ(cm)
5.57
Biometry

BPD:        83  mm     G. Age:  33w 3d         44  %    CI:        77.57   %    70 - 86
FL/HC:      21.3   %    19.9 -
HC:      298.3  mm     G. Age:  33w 0d         10  %    HC/AC:      0.97        0.96 -
AC:      306.4  mm     G. Age:  34w 4d         82  %    FL/BPD:     76.5   %    71 - 87
FL:       63.5  mm     G. Age:  32w 6d         24  %    FL/AC:      20.7   %    20 - 24
HUM:      55.7  mm     G. Age:  32w 3d         37  %

Est. FW:    9999  gm      5 lb 1 oz     67  %
Gestational Age

LMP:           33w 3d        Date:  10/15/16                 EDD:   07/22/17
U/S Today:     33w 3d                                        EDD:   07/22/17
Best:          33w 3d     Det. By:  LMP  (10/15/16)          EDD:   07/22/17
Anatomy

Cranium:               Appears normal         Aortic Arch:            Previously seen
Cavum:                 Appears normal         Ductal Arch:            Previously seen
Ventricles:            Appears normal         Diaphragm:              Appears normal
Choroid Plexus:        Previously seen        Stomach:                Appears normal, left
sided
Cerebellum:            Previously seen        Abdomen:                Appears normal
Posterior Fossa:       Previously seen        Abdominal Wall:         Previously seen
Nuchal Fold:           Previously seen        Cord Vessels:           Previously seen
Face:                  Orbits prev seen;      Kidneys:                Appear normal
profile nl
Lips:                  Appears normal         Bladder:                Appears normal
Thoracic:              Appears normal         Spine:                  Previously seen
Heart:                 Appears normal         Upper Extremities:      Previously seen
(4CH, axis, and situs
RVOT:                  Appears normal         Lower Extremities:      Previously seen
LVOT:                  Appears normal

Other:  Fetus appears to be a female. Heels and 5th digit prev visualized.
Cervix Uterus Adnexa

Cervix
Length:            4.6  cm.
Normal appearance by transvaginal scan
Impression

SIUP at 33+3 weeks
Normal interval anatomy; anatomic survey complete
Normal amniotic fluid volume
Appropriate interval growth with EFW at the 67th %tile
EV views of cervix: normal length without funneling; inferior
edge of posterior placenta ends 4.1 cms from internal os
Posterior placenta; no previa
Recommendations

Follow-up as clinically indicated
